# Patient Record
Sex: Male | Born: 1965 | Race: White | Hispanic: No | Marital: Married | Smoking: Former smoker
Health system: Southern US, Community
[De-identification: ages and names within clinical notes are randomized; demographics above are authoritative.]

## PROBLEM LIST (undated history)

## (undated) DIAGNOSIS — I2510 Atherosclerotic heart disease of native coronary artery without angina pectoris: Secondary | ICD-10-CM

## (undated) DIAGNOSIS — Z720 Tobacco use: Secondary | ICD-10-CM

## (undated) DIAGNOSIS — E785 Hyperlipidemia, unspecified: Secondary | ICD-10-CM

## (undated) DIAGNOSIS — I10 Essential (primary) hypertension: Secondary | ICD-10-CM

## (undated) HISTORY — DX: Atherosclerotic heart disease of native coronary artery without angina pectoris: I25.10

## (undated) HISTORY — DX: Essential (primary) hypertension: I10

## (undated) HISTORY — DX: Hyperlipidemia, unspecified: E78.5

## (undated) HISTORY — DX: Tobacco use: Z72.0

---

## 2002-10-06 HISTORY — PX: CORONARY ANGIOPLASTY: SHX604

## 2003-01-05 ENCOUNTER — Encounter: Payer: Self-pay | Admitting: Urology

## 2003-01-05 ENCOUNTER — Ambulatory Visit (HOSPITAL_BASED_OUTPATIENT_CLINIC_OR_DEPARTMENT_OTHER): Admission: RE | Admit: 2003-01-05 | Discharge: 2003-01-05 | Payer: Self-pay | Admitting: Urology

## 2003-08-07 HISTORY — PX: KIDNEY STONE SURGERY: SHX686

## 2003-08-19 ENCOUNTER — Inpatient Hospital Stay (HOSPITAL_COMMUNITY): Admission: EM | Admit: 2003-08-19 | Discharge: 2003-08-22 | Payer: Self-pay | Admitting: Emergency Medicine

## 2009-07-09 ENCOUNTER — Ambulatory Visit (HOSPITAL_COMMUNITY): Admission: RE | Admit: 2009-07-09 | Discharge: 2009-07-09 | Payer: Self-pay | Admitting: Urology

## 2010-12-14 IMAGING — CR DG ABDOMEN 1V
2 series · 2 of 2 positions shown · non-contrast
Comparison: None available.

CLINICAL DATA: Left renal stone.  Preoperative film.

ABDOMEN - 1 VIEW

[t abdomen supine (1 of 2)]
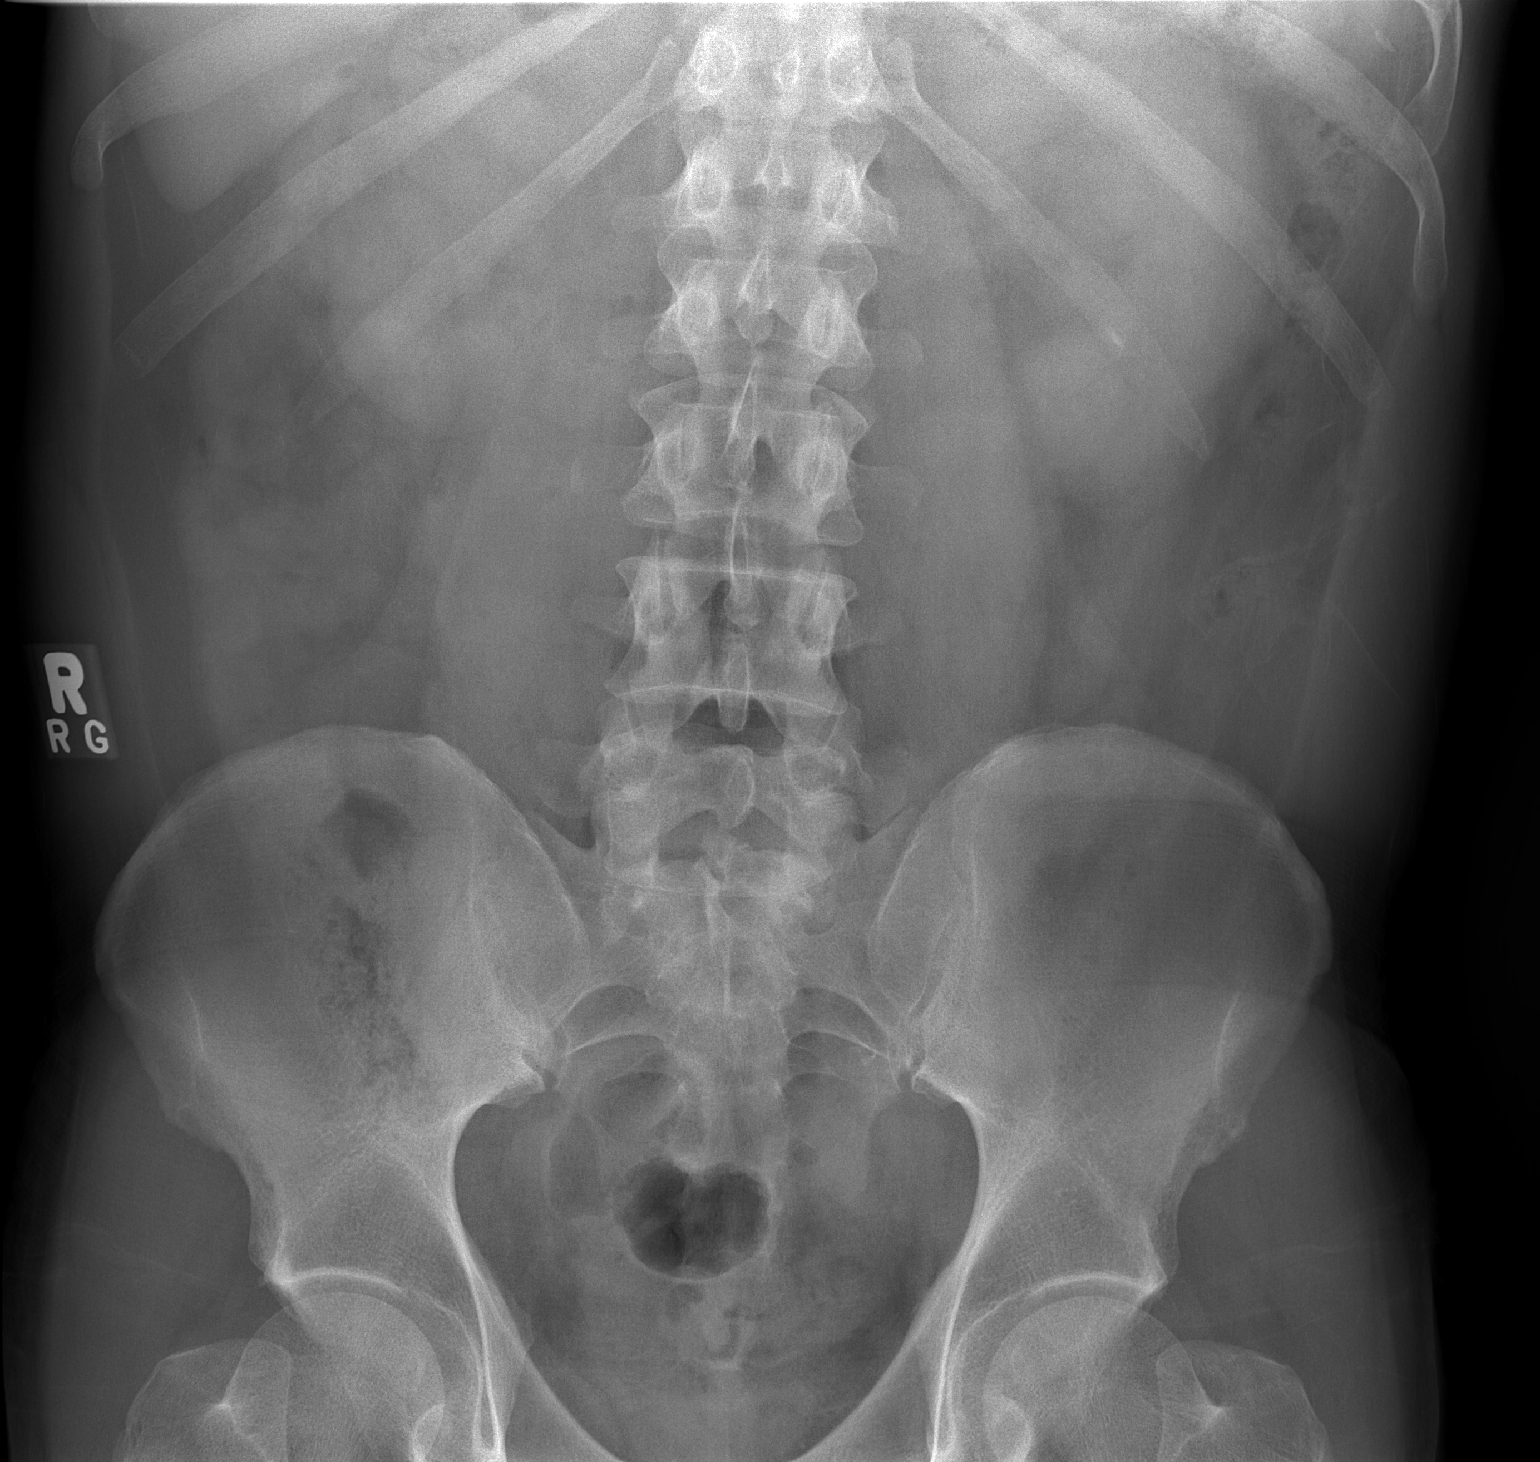

[t abdomen supine (2 of 2)]
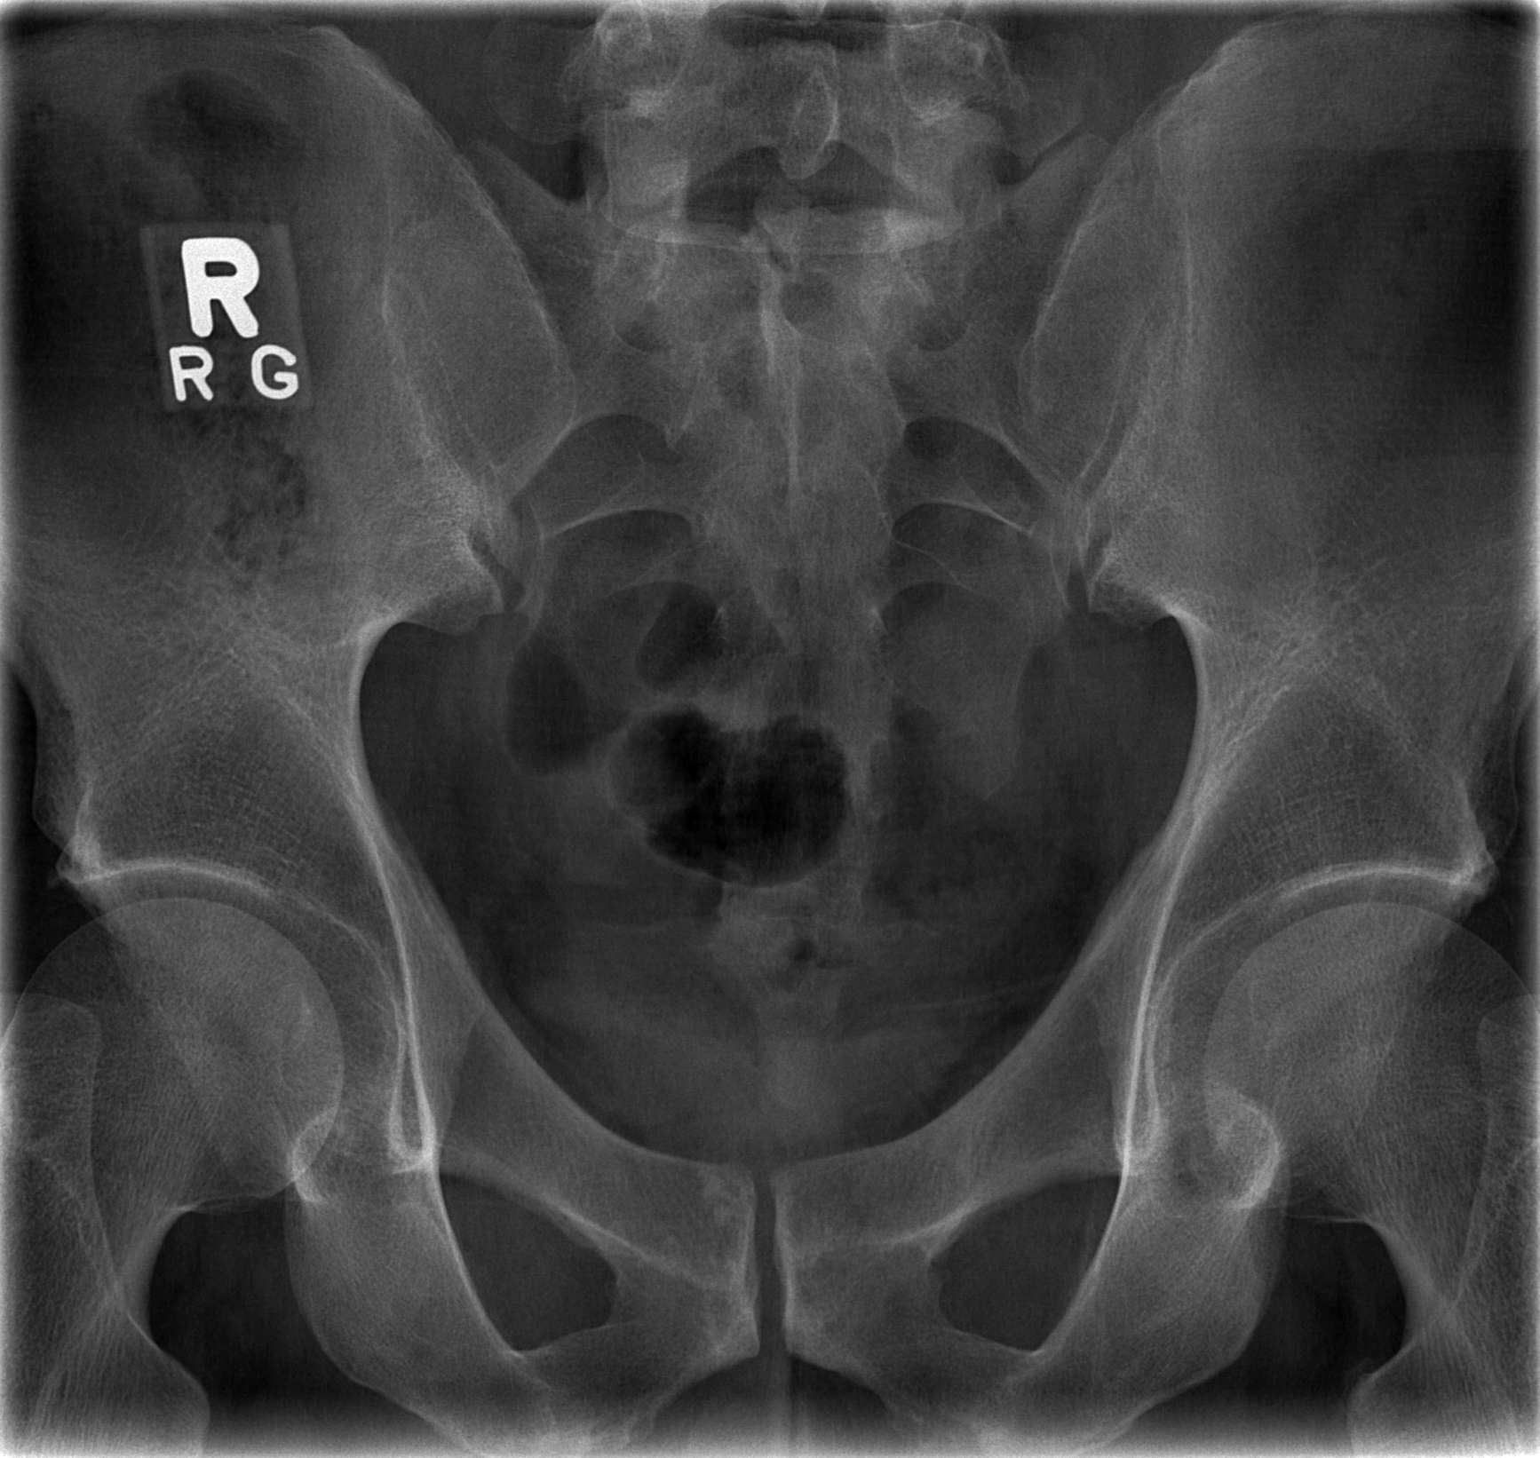

[2 of 2 positions shown; findings below may reference images not displayed]

FINDINGS: There is a radiopaque density projecting over the lower
pole left kidney and left twelfth rib measuring 0.4 cm compatible
with a renal stone.  No other unexpected abdominal calcifications
are identified.  Bowel gas pattern is unremarkable.
IMPRESSION: Findings compatible with a 0.4 cm left renal stone.  Alternatively,
this could represent a small bone island in the left 12th rib.

## 2011-02-21 NOTE — Cardiovascular Report (Signed)
NAME:  Mark Stafford, Mark Stafford                            ACCOUNT NO.:  [id number]   MEDICAL RECORD NO.:  [id number]                   PATIENT TYPE:  INP   LOCATION:  2927                                 FACILITY:  [facility]   PHYSICIAN:  Richard A. Alanda Amass, M.D.          DATE OF BIRTH:  08-14-1966   DATE OF PROCEDURE:  08/19/2003  DATE OF DISCHARGE:                              CARDIAC CATHETERIZATION   PROCEDURE:  Retrograde central aortic catheterization, selective coronary  angiography by Judkins technique, left ventricular angiogram by RAO and LAO  projections, subselective left internal mammary artery, right internal  mammary artery, abdominal aortic angiogram, hand injection, recanalizations  distal left anterior descending total occlusion with ongoing apical  myocardial infarction and associated wall motion abnormality, POBA,  Integrilin double bolus plus infusion, Plavix 300 emergency, 300 lab p.o.  Aspirin administration.  Weight adjusted heparin.   CARDIOLOGIST:  Richard A. Alanda Amass, M.D.   INDICATIONS:  Mr. Hartig is a 45 year old white married father of two boys  (82 and 31).  He is a 1-2 pack a day smoker and works at [employer].  He has no  prior coronary history.  Both parents are living with no coronary history,  but he has a maternal uncle with an myocardial infarction in his 25s.  There  is no history of hypertension, diabetes or thyroid disease.  He does not  take regular aspirin and cholesterol status is unknown.   He had a one week history of flu-like illness with mild cough and some mild  muscle aches.  He awoke at 7 a.m. on August 19, 2003 with left shoulder  discomfort that was persistent.  He had some vague upper sternal discomfort  and some vague radiation to his right shoulder.  He was [facility] Everlene [facility]  EKG showed inferolateral ST segment elevation and the  patient was referred for further evaluation.   He was transferred by EMT to the emergency  room where he had vague left  shoulder discomfort and no other significant symptoms.  He did have a run of  4-5 beats of rapid VT and SVT, asymptomatic.  He was hemodynamically stable.  There were no evolutionary changes but there was persistent inferolateral ST  elevation compatible with ischemia.  Early enzyme elevation of MB to 11.6,  troponin to 0.5 and myoglobin to 230 were present.  It was elected to  proceed to the catheterization laboratory for emergency catheterization in  this setting.   The patient was brought to the second floor.  He was given 5000 units of  heparin in the emergency room, 5 mg of Lopressor IV, one adult aspirin  (previous four baby aspirin at [facility]) and double bolus Integrilin and  Integrilin drip were begun along with 300 mg of Plavix p.o.   DESCRIPTION OF PROCEDURE:  The patient was brought to the second floor CP  Lab.  The  right groin was prepped and draped in the usual manner.  Then 1%  Xylocaine was used for local anesthesia and the CRFA was entered with a  single puncture using an 18 thin wall needle and a 6 French short Diag side-  arm sheath was inserted without difficulty.  Diagnostic procedure was done  with the  French 4 cm taper preformed coronary and pigtail Cordis catheters.  The LV angiogram was done in the ROA and LOA projections, 25 mL, 14 mL per  second, 28 mL, 12 mL per second.  Pullback pressure in the CA showed no  gradient across the aortic valve.   Subselective LIMA and RIMA were done with the right coronary catheter which  revealed patent IMAs, antegrade vertebral flow, no brachiocephalic or  subclavian stenosis.   Hand injection above the level of the renal arteries with the pigtail  catheter was performed showing bilateral single normal renal arteries and  normal-appearing infrarenal abdominal aorta on limited injection.   PRESSURES:  LV 130/8; LV-EDP 18-20 mmHg.  CA: 130/78 mmHg.   There was no gradient across the aortic  valve on catheter pullback.   LEFT VENTRICULAR ANGIOGRAM:  The left ventricular angiogram revealed distal  quarter of the anterolateral wall apical and distal quarter of the inferior  wall hypo-akinesis and posterior and septal areas with hypo-akinesis. The  remainder of the ventricle contracted well.  EF was greater than 50% and  there was no significant MR.   CORONARY ANGIOGRAPHY:  1. The main left coronary artery was normal.  2. The circumflex was a dominant vessel.  There was mild irregularity with     less than 30% narrowing in the proximal third after the small first and     second marginal branches.  There was a moderately large fourth marginal     branch that had 40% narrowing proximally and 40-50% narrowing just     proximal to the mid bifurcation but good flow.  3. The PLA had no significant stenosis; two large PDA branches were seen and     the second PDA branch had a 40-50% narrowing but good flow and no     thrombus.  4. The left anterior descending had mild irregularity in the proximal third     after the first septal perforator and a large first diagonal that was     normal.  There was a moderate size diagonal from the junction of the mid     LAD.  There was total occlusion of the distal third of the LAD to the     apex.  The undersurface bifurcation was filled late by string-like     collaterals.  5. The right coronary was nondominant.  There were irregularities proximal     with 50% narrowing in the mid portion and then predominantly small to     moderate size RV branches.   DISCUSSION:  It appears that this patient's apical MI is probably related to  distal LAD-apical occlusion with associated wall motion abnormality.  It is  not clear how much of this is atherosclerotic versus thrombotic, and he  might be somewhat hypercoagulable.  Despite his 5000 units of heparin in the emergency room and double bolus Aggrastat infusion he still had a  subtherapeutic ACT in the  lab, and he was given another 5000 units IV.  He  was given 4 mg of Nubain and 4 mg of Versed for sedation and another 5 mg IV  Lopressor in the laboratory.  The patient did have ongoing substernal chest discomfort associated with  angiography and left shoulder discomfort that was persistent so it was  elected to proceed with attempted recannulization of the distal LAD.   The LAD after the ACT was therapeutic and it was intubated with a JL-4 soft  tip 6 [place] [employer] guiding catheter.  A __________ light guidewire was  advanced under fluoroscopic control to the distal left anterior descending.  We were able to recanalize the distal LAD, and the wire was free in the  undersurface.  A small 2.0/12 ACS Voyager balloon was then passed to the  distal LAD near the apex and low pressure inflations were done from 3-5  atmospheres x38-55 seconds x5.  The balloon was pulled back.  There was  initially some guidewire spasm which was relieved with guidewire removal and  200 mcg of IC nitroglycerin.  Final injection showed restoration of TIMI-3  flow.  There was a little pinch at the very apex but we decided not to  pursue this further.  There was moderate size distal LAD vessel and good  bifurcation visualized.   The dilatation system was removed.  Side arm sheath was flushed and secured  to the skin with #1 silk suture and finally ACT was 237 seconds.   The patient was given an additional 300 mg of Plavix in the laboratory  (total 600 mg).  We planned to discontinue his heparin and continue 2B/3A  inhibitors, aspirin, Plavix and beta-blockers. I  would recommend  institution statin agent while his fasting lipids are pending.   It is not clear whether this patient had atherosclerotic and/or thrombotic  occlusion of the distal LAD.  It is noncritical disease of the dominant  circumflex and nondominant right and despite his young age of 72, he does  have atherosclerotic vascular disease.    Final perfusion time was approximately 10 hours 30 minutes from the onset of  pain.   CATHETERIZATION DIAGNOSES:  1. Atherosclerotic heart disease, acute apical myocardial infarction.  2. Successful recannulization and reperfusion with POBA.  RT 10 hours 30     minutes.  Restoration of TIMI-3 flow.  Small infarction.  3. Noncritical associated coronary disease dominant in the circumflex.  4. Ejection fraction of approximately 50% or greater.  5. Cholesterol status unknown.  6. Mild hypertension.   RECOMMENDATIONS:  Recommend lifestyle changes with exercise program,  discontinuation of smoking, and continuing medical therapy.                                               Richard A. Alanda Amass, M.D.    RAW/MEDQ  D:  08/19/2003  T:  08/19/2003  Job:  [id number]   cc:   Dani Gobble, MD  [phone] [address]  [place]  [place] [address]  Fax: [phone]   Tracey Harries, M.D. [address]  [place]  [place] [address]  Fax: [phone]   Catheterization Lab - 2nd floor

## 2011-02-21 NOTE — Discharge Summary (Signed)
NAME:  Mark Stafford, Mark Stafford                            ACCOUNT NO.:  [id number]   MEDICAL RECORD NO.:  [id number]                   PATIENT TYPE:  INP   LOCATION:  2004                                 FACILITY:  [facility]   PHYSICIAN:  Richard A. Alanda Amass, M.D.          DATE OF BIRTH:  [birth year]   DATE OF ADMISSION:  08/19/2003  DATE OF DISCHARGE:                                 DISCHARGE SUMMARY   CHIEF COMPLAINT:  Chest pain and cough.   HISTORY OF PRESENT ILLNESS:  The patient is a 45 year old male who presents  to the emergency room with complaints of cough and chest pain.  He has had  one to two weeks of upper respiratory type symptoms with dry cough and  flulike muscle aches.  He denies any fever or chills.  On the day of  admission, he awoke with some left shoulder pain and chest discomfort.  He  came to urgent care.  EKG shows some inferior lateral ST changes and  elevation and he was transferred to [facility] ER.  He  was treated with aspirin and nitroglycerin at urgent care with improvement  in his symptoms.  When he arrived at [facility] ER he  was pain-free.   MEDICATIONS:  He has no medications at home.   ALLERGIES:  No known drug allergies.   SOCIAL HISTORY:  He is married.  He works for [employer].  He has two  children, both boys from a previous marriage.  He does smoke a pack a day.   FAMILY HISTORY:  This is remarkable in that he had an uncle who had an MI in  his 43s.  Both his mother and father are alive and well without serious  medical problems.   REVIEW OF SYMPTOMS:  Essentially unremarkable except for noted above.  He  does have a history of kidney stones and had lithotripsy in June of 2004.  He was followed by Dr. Annabell Howells.  There is no prior history of diabetes or  hypertension and his lipid status is unknown.  Review of systems otherwise  unremarkable.   PAST MEDICAL HISTORY:  Otherwise unremarkable.   PHYSICAL  EXAMINATION:  GENERAL APPEARANCE:  He is a well-developed, well-  nourished male in no acute distress.  VITAL SIGNS:  Blood pressure 140/70, pulse 90, he is afebrile.  HEENT:  Normocephalic.  Extraocular movements are intact.  Sclera is  nonicteric.  Lids and conjunctivae are within normal limits.  NECK:  Without JVD, without bruits.  Thyroid is not enlarged.  CHEST:  Clear to auscultation and percussion.  CARDIOVASCULAR:  Regular rate and rhythm with no murmurs, rubs, or gallops.  Normal S1 and S2.  ABDOMEN:  Nontender with no hepatosplenomegaly, no bruits.  EXTREMITIES:  No edema.  Distal pulses are intact.  NEUROLOGIC:  Grossly intact.  He is awake, alert and oriented.  He is  cooperative and moves all extremities without obvious deficit.   His EKG shows inferior lateral ST elevation consistent with inferolateral  MI.   IMPRESSION:  1. Inferior lateral myocardial infarction by EKG.  2. History of smoking.  3. Family history of coronary disease.   PLAN:  The patient was seen by Dr. Alanda Amass.  Initial CK-MBs were elevated.  Troponin was 0.5.  He was taken to the catheterization lab for further  evaluation.      Abelino Derrick, P.A.                      Richard A. Alanda Amass, M.D.    Lenard Lance  D:  08/22/2003  T:  08/22/2003  Job:  [id number]   cc:   Brett Canales A. Cleta Alberts, M.D.  [address]  [place]  [place] [address]  Fax: [phone]

## 2011-02-21 NOTE — Discharge Summary (Signed)
NAME:  Mark Stafford, Mark Stafford                            ACCOUNT NO.:  [id number]   MEDICAL RECORD NO.:  [id number]                   PATIENT TYPE:  INP   LOCATION:  2004                                 FACILITY:  [facility]   PHYSICIAN:  Richard A. Alanda Amass, M.D.          DATE OF BIRTH:  [birth year]   DATE OF ADMISSION:  08/19/2003  DATE OF DISCHARGE:  08/22/2003                                 DISCHARGE SUMMARY   DISCHARGE DIAGNOSES:  1. Acute apical myocardial infarction, treated with left anterior descending     artery percutaneous coronary intervention this admission.  2. History of smoking.  3. Dyslipidemia.  4. Family history of coronary disease.  5. History of nephrolithiasis.   HOSPITAL COURSE:  The patient is a 45 year old male who had been previously  been followed by Dr. Cleta Alberts [facility]. [facility] with history of one to two weeks of upper respiratory type  symptoms. On the day he presented, he had some chest pain and left shoulder  pain. EKG at [facility] was abnormal with inferolateral ST elevation. He  was transferred to [facility] ER and seen by Dr. Alanda Amass. Symptoms did  improve with nitroglycerin and aspirin. His troponins were elevated  initially at 0.5 with a MB of 11. He was taken urgently to the  catheterization lab by Dr. Alanda Amass. Catheterization revealed a 50%  nondominant RCA, 30% proximal circumflex, 40% OM2, 40% distal circumflex,  and a totalled distal LAD. EF was 50%. There was apical hypokinesis. The  patient underwent PCI to the LAD. He tolerated this well. He was put on  Integrilin for 18 hours. Plan is for continued aggressive medical therapy  and risk factor modification. The patient was transferred to the floor. His  CKs peaked at 510 with 55 MBs. Lipid profile revealed a HDL of 31, LDL of  92. Statin and Niaspan were added. We feel he can be discharged August 22, 2003.   DISCHARGE MEDICATIONS:  1. Coated aspirin  once a day.  2. Plavix 75 mg a day.  3. Toprol-XL 50 mg a day.  4. Protonix 40 mg a day for one month and then p.r.n.  5. Wellbutrin SR 150 once a day for two days and then twice a day for two     months.  6. Zithromax 250 mg a day for two days.  7. Nitroglycerin sublingual p.r.n.  8. Advicor 500/20 one h.s.   LABORATORY DATA:  Sodium 139, potassium 3.7, BUN 8, creatinine 1.0. SGOT was  40, SGPT 27. White count 12.4, hemoglobin 13.9, hematocrit 40.6, platelets  325. Lipid profile showed a total cholesterol of 187, triglycerides 306, HDL  35, LDL 91. INR 0.9. Sodium 139, potassium 3.7, BUN 8, creatinine 1.0. CKs  peaked at 510 with 55 MBs. Chest x-ray shows no active disease. EKG at  discharge shows sinus rhythm with inferior T-wave inversion and  lateral T-  wave inversion.   DISPOSITION:  The patient is discharged in stable condition. He has been  instructed not to return to work until cleared by Dr. Alanda Amass. He has also  been instructed to avoid driving. We will see him back in the office in two  weeks.      Abelino Derrick, P.A.                      Richard A. Alanda Amass, M.D.    Lenard Lance  D:  08/22/2003  T:  08/22/2003  Job:  [id number]   cc:   Brett Canales A. Cleta Alberts, M.D.  [address]  [place]  [place] [address]  Fax: [phone]

## 2013-06-08 ENCOUNTER — Other Ambulatory Visit: Payer: Self-pay | Admitting: *Deleted

## 2013-06-08 DIAGNOSIS — I2510 Atherosclerotic heart disease of native coronary artery without angina pectoris: Secondary | ICD-10-CM

## 2013-06-09 ENCOUNTER — Encounter: Payer: Self-pay | Admitting: Cardiovascular Disease

## 2013-10-11 ENCOUNTER — Inpatient Hospital Stay (HOSPITAL_COMMUNITY): Admission: RE | Admit: 2013-10-11 | Payer: Self-pay | Source: Ambulatory Visit

## 2013-10-27 ENCOUNTER — Ambulatory Visit (HOSPITAL_COMMUNITY)
Admission: RE | Admit: 2013-10-27 | Discharge: 2013-10-27 | Disposition: A | Discharge status: Home / Self Care | Payer: BC Managed Care – PPO | Source: Ambulatory Visit | Attending: Internal Medicine | Admitting: Internal Medicine

## 2013-10-27 DIAGNOSIS — I079 Rheumatic tricuspid valve disease, unspecified: Secondary | ICD-10-CM | POA: Insufficient documentation

## 2013-10-27 DIAGNOSIS — I2510 Atherosclerotic heart disease of native coronary artery without angina pectoris: Secondary | ICD-10-CM | POA: Insufficient documentation

## 2013-10-27 DIAGNOSIS — I059 Rheumatic mitral valve disease, unspecified: Secondary | ICD-10-CM

## 2013-10-27 NOTE — Progress Notes (Signed)
2D Echo Performed 10/27/2013    Maha Fischel, RCS  

## 2013-11-16 ENCOUNTER — Telehealth: Payer: Self-pay | Admitting: *Deleted

## 2013-11-16 NOTE — Telephone Encounter (Signed)
Pt was calling in regards to his medication that Dr. Alanda AmassWeintraub used to prescribe to him. I do not see any medication listed in his chart. He stated that [facility] [employer] has tried to contact us and there is no reply from us. He is completely out of medication.  RAW-JB

## 2013-11-17 MED ORDER — ATORVASTATIN CALCIUM 80 MG PO TABS: 80.0000 mg | ORAL_TABLET | Freq: Every day | ORAL | Status: DC

## 2013-11-17 MED ORDER — PANTOPRAZOLE SODIUM 40 MG PO TBEC: 40.0000 mg | DELAYED_RELEASE_TABLET | Freq: Every day | ORAL | Status: DC

## 2013-11-17 MED ORDER — NEBIVOLOL HCL 5 MG PO TABS: 5.0000 mg | ORAL_TABLET | Freq: Every day | ORAL | Status: DC

## 2013-11-17 NOTE — Telephone Encounter (Signed)
Returned call.  Left message that message received, but not the name of medication.  Asked that he call back today before 4pm w/ that information so script can be sent to pharmacy.

## 2013-11-17 NOTE — Telephone Encounter (Signed)
Pt called back and verified x 2.  Pt stated he left information yesterday w/ what he needed.  Pt informed the message did not contain a list of meds, but RN can send in refills if he can list them now for 90-day w/o refills until he is seen by new cardiologist.  Pt verbalized understanding and agreed w/ plan.  Pantoprazole 40 mg daily, atorvastatin 80 mg daily and Bystolic 5 mg daily.  Pt stated he is out of all meds and informed samples available of Bystolic until mail order arrives.  Pt verbalized understanding and agreed w/ plan.  Pt will pick up samples and restart other meds once received.   Refill(s) sent to pharmacy.

## 2013-12-02 ENCOUNTER — Telehealth: Payer: Self-pay | Admitting: Cardiovascular Disease

## 2013-12-02 DIAGNOSIS — E782 Mixed hyperlipidemia: Secondary | ICD-10-CM

## 2013-12-02 DIAGNOSIS — Z79899 Other long term (current) drug therapy: Secondary | ICD-10-CM

## 2013-12-02 MED ORDER — NEBIVOLOL HCL 5 MG PO TABS: 5.0000 mg | ORAL_TABLET | Freq: Every day | ORAL | Status: DC

## 2013-12-02 MED ORDER — ATORVASTATIN CALCIUM 80 MG PO TABS: 80.0000 mg | ORAL_TABLET | Freq: Every day | ORAL | Status: DC

## 2013-12-02 MED ORDER — PANTOPRAZOLE SODIUM 40 MG PO TBEC: 40.0000 mg | DELAYED_RELEASE_TABLET | Freq: Every day | ORAL | Status: DC

## 2013-12-02 MED ORDER — FENOFIBRATE 145 MG PO TABS: 145.0000 mg | ORAL_TABLET | Freq: Every day | ORAL | Status: DC

## 2013-12-02 MED ORDER — CLOPIDOGREL BISULFATE 75 MG PO TABS: 75.0000 mg | ORAL_TABLET | Freq: Every day | ORAL | Status: DC

## 2013-12-02 MED ORDER — NIACIN ER (ANTIHYPERLIPIDEMIC) 1000 MG PO TBCR: 1000.0000 mg | EXTENDED_RELEASE_TABLET | Freq: Every day | ORAL | Status: DC

## 2013-12-02 NOTE — Telephone Encounter (Signed)
Walk-In  Pt with concerns about refills that were supposed to be sent 2 weeks ago.  Reviewed chart and Rxs printed.  Pt informed and RN apologized.  Resent Rxs.  Pt also needed refills on other meds.  Paper chart reviewed w/ pt and refills sent.  Pt will get refills on NTG and Cialis prn.  Pt has new appt on 3.17.15 w/ Dr. Allyson SabalBerry to establish care (former RW pt).    Pt also informed labs needed r/t warning w/ fenofibrate and atorvastatin.  Spoke w/ Abbe AmsterdamKathryn Vogel, RN (Dr. Allyson SabalBerry) who advised pt have CMP and Lipid Profile.  Labs ordered and pt advised to have completed no later than 3.16.15.  Pt verbalized understanding and agreed w/ plan.  Pt discharged w/ lab slip and samples of Bystolic in hand.  Pt will call back if he needs temporary Rx of clopidogrel sent to local pharmacy.

## 2013-12-02 NOTE — Telephone Encounter (Signed)
Pt walked in to speak with Amber about refills.

## 2013-12-16 LAB — COMPREHENSIVE METABOLIC PANEL
ALT: 16 U/L (ref 0–53)
AST: 25 U/L (ref 0–37)
Albumin: 4.5 g/dL (ref 3.5–5.2)
Alkaline Phosphatase: 44 U/L (ref 39–117)
BUN: 14 mg/dL (ref 6–23)
CO2: 27 mEq/L (ref 19–32)
Calcium: 9.5 mg/dL (ref 8.4–10.5)
Chloride: 106 mEq/L (ref 96–112)
Creat: 0.89 mg/dL (ref 0.50–1.35)
Glucose, Bld: 93 mg/dL (ref 70–99)
Potassium: 4.5 mEq/L (ref 3.5–5.3)
Sodium: 141 mEq/L (ref 135–145)
Total Bilirubin: 0.6 mg/dL (ref 0.2–1.2)
Total Protein: 6.9 g/dL (ref 6.0–8.3)

## 2013-12-16 LAB — LIPID PANEL
Cholesterol: 165 mg/dL (ref 0–200)
HDL: 45 mg/dL (ref >39)
LDL Cholesterol: 100 mg/dL — ABNORMAL HIGH (ref 0–99)
Total CHOL/HDL Ratio: 3.7 Ratio
Triglycerides: 100 mg/dL (ref <150)
VLDL: 20 mg/dL (ref 0–40)

## 2013-12-19 ENCOUNTER — Encounter: Payer: Self-pay | Admitting: *Deleted

## 2013-12-20 ENCOUNTER — Encounter: Payer: Self-pay | Admitting: Cardiovascular Disease

## 2013-12-20 ENCOUNTER — Ambulatory Visit (INDEPENDENT_AMBULATORY_CARE_PROVIDER_SITE_OTHER): Payer: BC Managed Care – PPO | Admitting: Cardiovascular Disease

## 2013-12-20 VITALS — BP 122/80 | HR 68 | Ht 67.0 in | Wt 156.5 lb

## 2013-12-20 DIAGNOSIS — E785 Hyperlipidemia, unspecified: Secondary | ICD-10-CM

## 2013-12-20 DIAGNOSIS — I10 Essential (primary) hypertension: Secondary | ICD-10-CM | POA: Insufficient documentation

## 2013-12-20 DIAGNOSIS — I2510 Atherosclerotic heart disease of native coronary artery without angina pectoris: Secondary | ICD-10-CM | POA: Insufficient documentation

## 2013-12-20 DIAGNOSIS — F17200 Nicotine dependence, unspecified, uncomplicated: Secondary | ICD-10-CM

## 2013-12-20 DIAGNOSIS — Z79899 Other long term (current) drug therapy: Secondary | ICD-10-CM

## 2013-12-20 DIAGNOSIS — Z720 Tobacco use: Secondary | ICD-10-CM

## 2013-12-20 DIAGNOSIS — Z9861 Coronary angioplasty status: Secondary | ICD-10-CM

## 2013-12-20 MED ORDER — EZETIMIBE 10 MG PO TABS: 10.0000 mg | ORAL_TABLET | Freq: Every day | ORAL | Status: DC

## 2013-12-20 NOTE — Assessment & Plan Note (Signed)
On statin therapy as was fenofibrate and niacin. His most recent lipid profile performed yesterday revealed a glucose of 165, LDL 100 HDL 45. He is still not at goal. He apparently eats a healthy diet". I'm going to add Zetia in an attempt to get him to call. If I am unable to [facility] for further oncologic treatment.

## 2013-12-20 NOTE — Patient Instructions (Signed)
  Your physician wants you to follow-up with him in : 1 year with Dr Allyson SabalBerry                                            and with an extender in : 6 months with an extender                    You will receive a reminder letter in the mail one month in advance. If you don't receive a letter, please call our office to schedule the follow-up appointment.   Your physician recommends that you return for lab work in: 2 months, fasting   Your physician has recommended you make the following change in your medication:  Start zetia 10mg  daily

## 2013-12-20 NOTE — Assessment & Plan Note (Signed)
Under good control on current medications 

## 2013-12-20 NOTE — Assessment & Plan Note (Signed)
The patient has a history of known coronary artery disease. He had an anterior STEMI 08/19/03 and underwent cardiac catheterization by Dr. Alanda AmassWeintraub revealing an occlusion of the apical LAD which was angioplasty. he has a left dominant system with scattered 40-50% stenoses. He had apical hypokinesia at that time. Recent echo was normal a Myoview stress test done 3 years ago showed no infarct. The patient is completely asymptomatic.

## 2013-12-20 NOTE — Progress Notes (Signed)
12/20/2013 Mark Stafford   05-May-1966  [id number]  Primary Physician No PCP Per Patient Primary Cardiologist: Runell Gess MD Roseanne Reno   HPI:  Mark Stafford is a delightful 48 year old thin and fit appearing married Caucasian male father of 2 children he was formally a patient of Dr. Alanda Amass. I am assuming his care. His primary care physician is Dr. Silvestre Moment at [facility]. He has a history of CAD status post anterior wall myocardial infarction 08/19/03. He underwent cardiac catheterization by Dr. Alanda Amass revealing an occluded apical LAD which underwent angioplasty. He had a left dominant system with scattered 30-40% stenoses otherwise. His EF was 50% with apical hypokinesia at that time. This has since improved to normal. A Myoview stress test performed in 2001 and showed no ischemia or infarct. He is completely asymptomatic. 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 Is clinically side to profile is remarkable for ongoing tobacco abuse and at one half pack per day recalcitrant factor modification. He is treated hypertension and hyperlipidemia.   Current Outpatient Prescriptions  Medication Sig Dispense Refill  . aspirin 81 MG tablet Take 81 mg by mouth daily.      Marland Kitchen atorvastatin (LIPITOR) 80 MG tablet Take 1 tablet (80 mg total) by mouth daily.  90 tablet  0  . clopidogrel (PLAVIX) 75 MG tablet Take 1 tablet (75 mg total) by mouth daily.  90 tablet  0  . fenofibrate (TRICOR) 145 MG tablet Take 1 tablet (145 mg total) by mouth daily.  90 tablet  0  . nebivolol (BYSTOLIC) 5 MG tablet Take 1 tablet (5 mg total) by mouth daily.  90 tablet  0  . niacin (NIASPAN) 1000 MG CR tablet Take 1 tablet (1,000 mg total) by mouth at bedtime.  90 tablet  0  . pantoprazole (PROTONIX) 40 MG tablet Take 1 tablet (40 mg total) by mouth daily.  90 tablet  0  . ezetimibe (ZETIA) 10 MG tablet Take 1 tablet (10 mg total) by mouth daily.  90 tablet  3   No current facility-administered  medications for this visit.    No Known Allergies  History   Social History  . Marital Status: Married    Spouse Name: N/A    Number of Children: N/A  . Years of Education: N/A   Occupational History  . Not on file.   Social History Main Topics  . Smoking status: Current Every Day Smoker -- 0.50 packs/day    Types: Cigarettes  . Smokeless tobacco: Not on file  . Alcohol Use: Not on file  . Drug Use: Not on file  . Sexual Activity: Not on file   Other Topics Concern  . Not on file   Social History Narrative  . No narrative on file     Review of Systems: General: negative for chills, fever, night sweats or weight changes.  Cardiovascular: negative for chest pain, dyspnea on exertion, edema, orthopnea, palpitations, paroxysmal nocturnal dyspnea or shortness of breath Dermatological: negative for rash Respiratory: negative for cough or wheezing Urologic: negative for hematuria Abdominal: negative for nausea, vomiting, diarrhea, bright red blood per rectum, melena, or hematemesis Neurologic: negative for visual changes, syncope, or dizziness All other systems reviewed and are otherwise negative except as noted above.    Blood pressure 122/80, pulse 68, height 5\' 7"  (1.702 m), weight 156 lb 8 oz (70.988 kg).  General appearance: alert and no distress Neck: no adenopathy, no carotid bruit, no JVD, supple,  symmetrical, trachea midline and thyroid not enlarged, symmetric, no tenderness/mass/nodules Lungs: clear to auscultation bilaterally Heart: regular rate and rhythm, S1, S2 normal, no murmur, click, rub or gallop Abdomen: soft, non-tender; bowel sounds normal; no masses,  no organomegaly Extremities: extremities normal, atraumatic, no cyanosis or edema and 2+ pedal pulses bilaterally  EKG normal sinus rhythm at 68 without ST or T wave changes  ASSESSMENT AND PLAN:   Coronary artery disease The patient has a history of known coronary artery disease. He had an anterior  STEMI 08/19/03 and underwent cardiac catheterization by Dr. Alanda AmassWeintraub revealing an occlusion of the apical LAD which was angioplasty. he has a left dominant system with scattered 40-50% stenoses. He had apical hypokinesia at that time. Recent echo was normal a Myoview stress test done 3 years ago showed no infarct. The patient is completely asymptomatic.  Essential hypertension Under good control on current medications  Hyperlipidemia On statin therapy as was fenofibrate and niacin. His most recent lipid profile performed yesterday revealed a glucose of 165, LDL 100 HDL 45. He is still not at goal. He apparently eats a healthy diet". I'm going to add Zetia in an attempt to get him to call. If I am unable to [facility] for further oncologic treatment.      Runell GessJonathan J. Dierre Crevier MD FACP,FACC,FAHA, [facility] 12/20/2013 8:47 AM

## 2014-04-10 ENCOUNTER — Other Ambulatory Visit: Payer: Self-pay | Admitting: Cardiovascular Disease

## 2014-04-10 NOTE — Telephone Encounter (Signed)
Rx refill sent to patient pharmacy   

## 2014-05-03 LAB — LIPID PANEL
Cholesterol: 103 mg/dL (ref 0–200)
HDL: 43 mg/dL (ref >39)
LDL Cholesterol: 43 mg/dL (ref 0–99)
Total CHOL/HDL Ratio: 2.4 Ratio
Triglycerides: 83 mg/dL (ref <150)
VLDL: 17 mg/dL (ref 0–40)

## 2014-05-03 LAB — HEPATIC FUNCTION PANEL
ALT: 24 U/L (ref 0–53)
AST: 22 U/L (ref 0–37)
Albumin: 4.7 g/dL (ref 3.5–5.2)
Alkaline Phosphatase: 47 U/L (ref 39–117)
Bilirubin, Direct: 0.1 mg/dL (ref 0.0–0.3)
Indirect Bilirubin: 0.4 mg/dL (ref 0.2–1.2)
Total Bilirubin: 0.5 mg/dL (ref 0.2–1.2)
Total Protein: 7.4 g/dL (ref 6.0–8.3)

## 2014-05-08 ENCOUNTER — Encounter: Payer: Self-pay | Admitting: *Deleted

## 2014-09-21 ENCOUNTER — Encounter: Payer: Self-pay | Admitting: Cardiology

## 2014-09-22 ENCOUNTER — Encounter: Payer: Self-pay | Admitting: Cardiology

## 2014-09-22 ENCOUNTER — Ambulatory Visit (INDEPENDENT_AMBULATORY_CARE_PROVIDER_SITE_OTHER): Payer: BC Managed Care – PPO | Admitting: Cardiology

## 2014-09-22 VITALS — BP 120/70 | HR 70 | Ht 67.0 in | Wt 154.1 lb

## 2014-09-22 DIAGNOSIS — I2510 Atherosclerotic heart disease of native coronary artery without angina pectoris: Secondary | ICD-10-CM

## 2014-09-22 DIAGNOSIS — Z720 Tobacco use: Secondary | ICD-10-CM

## 2014-09-22 DIAGNOSIS — Z9861 Coronary angioplasty status: Secondary | ICD-10-CM

## 2014-09-22 DIAGNOSIS — E785 Hyperlipidemia, unspecified: Secondary | ICD-10-CM

## 2014-09-22 DIAGNOSIS — I10 Essential (primary) hypertension: Secondary | ICD-10-CM

## 2014-09-22 NOTE — Assessment & Plan Note (Signed)
Controlled.  

## 2014-09-22 NOTE — Patient Instructions (Addendum)
Corine ShelterLuke Kilroy, [place], has ordered the following test(s) to be done: 1. Blood work - to be done FASTING PRIOR TO YOUR 6 MONTH APPOINTMENT WITH DR BERRY. You will receive a phone call to remind you and a lab slip in the mail.  Your physician has recommended making the following medication changes: STOP Clopidogrel and Fenofibrate  Corine ShelterLuke Kilroy, PA-C, wants you to follow-up in 6 months with Dr Allyson SabalBerry. You will receive a reminder letter in the mail one months in advance. If you don't receive a letter, please call our office to schedule the follow-up appointment.  Medication samples have been provided to the patient.  Drug name: Zetia 10 mg Qty: 28 tablets  LOT: [id number]  Exp.Date: 12/2016  Ian Bushmanruitt, [place] 11:07 AM 09/22/2014

## 2014-09-22 NOTE — Assessment & Plan Note (Signed)
LAD PCI '04. Myoview low risk 2012. No angina

## 2014-09-22 NOTE — Assessment & Plan Note (Signed)
LDL 43 July 2015 after addition of Zetia

## 2014-09-22 NOTE — Progress Notes (Signed)
09/22/2014 Mark Stafford   11/28/1965  [id number]  Primary Physician Mark KimHEPLER,MARK, PA-C Primary Cardiologist: Dr Mark Stafford  HPI:  48 year-old thin and fit appearing married Caucasian male father of 2 children, formally a patient of Dr. Alanda Stafford. Now followed by Dr Mark Stafford. His primary care physician is [facility]. He has a history of CAD and is status post anterior wall myocardial infarction 08/19/03. He underwent cardiac catheterization by Dr. Alanda Stafford revealing an occluded apical LAD treated with angioplasty. He had a left dominant system with scattered 30-40% stenoses otherwise. His EF was 50% with apical hypokinesia at that time. This has since improved to normal. A Myoview stress test performed in Nov 2011 and showed no ischemia or infarct. He is completely asymptomatic. He is here today for 6 month f/u. He asked about "getting of some of these medications".  Current Outpatient Prescriptions  Medication Sig Dispense Refill  . aspirin 81 MG tablet Take 81 mg by mouth daily.    Marland Kitchen. atorvastatin (LIPITOR) 80 MG tablet TAKE 1 BY MOUTH DAILY 90 tablet 1  . BYSTOLIC 5 MG tablet TAKE 1 BY MOUTH DAILY 90 tablet 1  . ezetimibe (ZETIA) 10 MG tablet Take 1 tablet (10 mg total) by mouth daily. 30 tablet 0  . niacin (NIASPAN) 1000 MG CR tablet TAKE 1 BY MOUTH AT BEDTIME 90 tablet 1  . pantoprazole (PROTONIX) 40 MG tablet TAKE 1 BY MOUTH DAILY 90 tablet 1  . traMADol (ULTRAM) 50 MG tablet Take 50 mg by mouth every 6 (six) hours as needed. for pain  1   No current facility-administered medications for this visit.    No Known Allergies  History   Social History  . Marital Status: Married    Spouse Name: N/A    Number of Children: N/A  . Years of Education: N/A   Occupational History  . Not on file.   Social History Main Topics  . Smoking status: Current Every Day Smoker -- 0.25 packs/day    Types: Cigarettes  . Smokeless tobacco: Not on file  . Alcohol Use: Not on file    . Drug Use: Not on file  . Sexual Activity: Not on file   Other Topics Concern  . Not on file   Social History Narrative     Review of Systems: General: negative for chills, fever, night sweats or weight changes.  Cardiovascular: negative for chest pain, dyspnea on exertion, edema, orthopnea, palpitations, paroxysmal nocturnal dyspnea or shortness of breath Dermatological: negative for rash Respiratory: negative for cough or wheezing Urologic: negative for hematuria Abdominal: negative for nausea, vomiting, diarrhea, bright red blood per rectum, melena, or hematemesis Neurologic: negative for visual changes, syncope, or dizziness All other systems reviewed and are otherwise negative except as noted above.    Blood pressure 120/70, pulse 70, height 5\' 7"  (1.702 m), weight 154 lb 1.6 oz (69.899 kg).  General appearance: alert, cooperative and no distress Neck: no carotid bruit and no JVD Lungs: clear to auscultation bilaterally Heart: regular rate and rhythm  EKG NSR  ASSESSMENT AND PLAN:   CAD S/P percutaneous coronary angioplasty LAD PCI '04. Myoview low risk 2012. No angina  Hyperlipidemia LDL 43 July 2015 after addition of Zetia  Essential hypertension Controlled  Tobacco abuse 1/2 PPD- currently enrolled in a stop smoking program   PLAN  I reviewed his medications and labs in detail with him. I think we can stop the Plavix and Tricor (trigs were 83). He  could probably come off the Niaspan as well but he seems to tolerate this. We'll repeat his lipids in 6 months and he can follow up with Dr Mark Stafford then.   Mark Stafford KPA-C 09/22/2014 11:04 AM

## 2014-09-22 NOTE — Assessment & Plan Note (Signed)
1/2 PPD- currently enrolled in a stop smoking program

## 2014-10-19 ENCOUNTER — Other Ambulatory Visit: Payer: Self-pay | Admitting: *Deleted

## 2014-10-19 MED ORDER — PANTOPRAZOLE SODIUM 40 MG PO TBEC: DELAYED_RELEASE_TABLET | ORAL | Status: DC

## 2014-10-19 MED ORDER — ATORVASTATIN CALCIUM 80 MG PO TABS: ORAL_TABLET | ORAL | Status: DC

## 2014-10-19 NOTE — Telephone Encounter (Signed)
RX sent to patients pharmacy [facility]. 

## 2014-10-23 ENCOUNTER — Other Ambulatory Visit: Payer: Self-pay

## 2014-10-23 MED ORDER — ATORVASTATIN CALCIUM 80 MG PO TABS: ORAL_TABLET | ORAL | Status: DC

## 2014-10-23 MED ORDER — PANTOPRAZOLE SODIUM 40 MG PO TBEC: DELAYED_RELEASE_TABLET | ORAL | Status: DC

## 2014-10-23 NOTE — Telephone Encounter (Signed)
Rx sent to pharmacy   

## 2015-02-26 ENCOUNTER — Telehealth: Payer: Self-pay

## 2015-02-26 DIAGNOSIS — E785 Hyperlipidemia, unspecified: Secondary | ICD-10-CM

## 2015-02-26 NOTE — Telephone Encounter (Signed)
-----   Message from Neta EhlersAngela M Clancey Welton, CMA sent at 09/25/2014  7:25 AM EST ----- Regarding: release labs lipid

## 2015-02-26 NOTE — Telephone Encounter (Signed)
Labs released and lab slip mailed to patient

## 2015-03-20 LAB — LIPID PANEL
Cholesterol: 125 mg/dL (ref 0–200)
HDL: 43 mg/dL (ref >=40)
LDL Cholesterol: 57 mg/dL (ref 0–99)
Total CHOL/HDL Ratio: 2.9 Ratio
Triglycerides: 124 mg/dL (ref <150)
VLDL: 25 mg/dL (ref 0–40)

## 2015-03-22 ENCOUNTER — Encounter: Payer: Self-pay | Admitting: *Deleted

## 2015-04-19 ENCOUNTER — Other Ambulatory Visit: Payer: Self-pay | Admitting: Cardiovascular Disease

## 2015-04-19 NOTE — Telephone Encounter (Signed)
Rx has been sent to the pharmacy electronically. ° °

## 2015-06-12 ENCOUNTER — Other Ambulatory Visit: Payer: Self-pay | Admitting: Cardiovascular Disease

## 2015-08-12 ENCOUNTER — Other Ambulatory Visit: Payer: Self-pay | Admitting: Cardiovascular Disease

## 2015-08-14 ENCOUNTER — Other Ambulatory Visit: Payer: Self-pay | Admitting: Cardiovascular Disease

## 2015-08-14 MED ORDER — EZETIMIBE 10 MG PO TABS: 10.0000 mg | ORAL_TABLET | Freq: Every day | ORAL | Status: DC

## 2015-08-14 MED ORDER — NEBIVOLOL HCL 5 MG PO TABS: 5.0000 mg | ORAL_TABLET | Freq: Every day | ORAL | Status: DC

## 2015-08-14 MED ORDER — NIACIN ER (ANTIHYPERLIPIDEMIC) 1000 MG PO TBCR: 1000.0000 mg | EXTENDED_RELEASE_TABLET | Freq: Every day | ORAL | Status: DC

## 2015-08-14 NOTE — Telephone Encounter (Signed)
°*  STAT* If patient is at the pharmacy, call can be transferred to refill team.   1. Which medications need to be refilled? (please list name of each medication and dose if known) Bystolic 5mg   , Zeita 10mg  and Niastan 1000mg    2. Which pharmacy/location (including street and city if local pharmacy) is medication to be sent to?[facility]   3. Do they need a 30 day or 90 day supply? 90

## 2015-08-14 NOTE — Telephone Encounter (Signed)
Pt's medications has been sent to his pharmacy. Confirmation received

## 2015-09-14 ENCOUNTER — Ambulatory Visit (INDEPENDENT_AMBULATORY_CARE_PROVIDER_SITE_OTHER): Payer: 59 | Admitting: Cardiovascular Disease

## 2015-09-14 ENCOUNTER — Encounter: Payer: Self-pay | Admitting: Cardiovascular Disease

## 2015-09-14 VITALS — BP 120/78 | HR 66 | Ht 67.0 in | Wt 159.6 lb

## 2015-09-14 DIAGNOSIS — I10 Essential (primary) hypertension: Secondary | ICD-10-CM

## 2015-09-14 DIAGNOSIS — I2510 Atherosclerotic heart disease of native coronary artery without angina pectoris: Secondary | ICD-10-CM | POA: Diagnosis not present

## 2015-09-14 DIAGNOSIS — E785 Hyperlipidemia, unspecified: Secondary | ICD-10-CM | POA: Diagnosis not present

## 2015-09-14 DIAGNOSIS — Z9861 Coronary angioplasty status: Secondary | ICD-10-CM | POA: Diagnosis not present

## 2015-09-14 MED ORDER — NEBIVOLOL HCL 5 MG PO TABS: 5.0000 mg | ORAL_TABLET | Freq: Every day | ORAL | Status: DC

## 2015-09-14 MED ORDER — PANTOPRAZOLE SODIUM 40 MG PO TBEC: DELAYED_RELEASE_TABLET | ORAL | Status: DC

## 2015-09-14 MED ORDER — ATORVASTATIN CALCIUM 80 MG PO TABS: ORAL_TABLET | ORAL | Status: DC

## 2015-09-14 MED ORDER — EZETIMIBE 10 MG PO TABS: 10.0000 mg | ORAL_TABLET | Freq: Every day | ORAL | Status: DC

## 2015-09-14 NOTE — Progress Notes (Signed)
09/14/2015 Lise Auer   04-04-1966  [id number]  Primary Physician Lovenia Kim, PA-C Primary Cardiologist: Runell Gess MD Roseanne Reno   HPI:  Mr. Cubit is a delightful 49 year old thin and fit appearing married Caucasian male father of 2 children he was formally a patient of Dr. Alanda Amass. I last saw him in the office 12/20/13. His primary care provider is Lovenia Kim [facility]. He has a history of CAD status post anterior wall myocardial infarction 08/19/03. He underwent cardiac catheterization by Dr. Alanda Amass revealing an occluded apical LAD which underwent angioplasty. He had a left dominant system with scattered 30-40% stenoses otherwise. His EF was 50% with apical hypokinesia at that time. This has since improved to normal. A Myoview stress test performed in 2001 and showed no ischemia or infarct. He is completely asymptomatic. 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 He is treated hypertension and hyperlipidemia   Current Outpatient Prescriptions  Medication Sig Dispense Refill  . aspirin 81 MG tablet Take 81 mg by mouth daily.    Marland Kitchen atorvastatin (LIPITOR) 80 MG tablet TAKE 1 BY MOUTH DAILY 90 tablet 1  . ezetimibe (ZETIA) 10 MG tablet Take 1 tablet (10 mg total) by mouth daily. Need appointment before anymore refills 90 tablet 0  . nebivolol (BYSTOLIC) 5 MG tablet Take 1 tablet (5 mg total) by mouth daily. Need appointment before anymore refills 90 tablet 0  . niacin (NIASPAN) 1000 MG CR tablet Take 1 tablet (1,000 mg total) by mouth at bedtime. Need appointment before anymore refills 90 tablet 0  . pantoprazole (PROTONIX) 40 MG tablet Take 1 tablet by mouth  daily 90 tablet 0  . traMADol (ULTRAM) 50 MG tablet Take 50 mg by mouth every 6 (six) hours as needed. for pain  1  . sertraline (ZOLOFT) 25 MG tablet Take 25 mg by mouth  daily. Take 1 tab daily  2   No current facility-administered medications for this visit.    No Known Allergies  Social History   Social History  . Marital Status: Married    Spouse Name: N/A  . Number of Children: N/A  . Years of Education: N/A   Occupational History  . Not on file.   Social History Main Topics  . Smoking status: Current Every Day Smoker -- 0.25 packs/day    Types: Cigarettes  . Smokeless tobacco: Not on file  . Alcohol Use: Not on file  . Drug Use: Not on file  . Sexual Activity: Not on file   Other Topics Concern  . Not on file   Social History Narrative     Review of Systems: General: negative for chills, fever, night sweats or weight changes.  Cardiovascular: negative for chest pain, dyspnea on exertion, edema, orthopnea, palpitations, paroxysmal nocturnal dyspnea or shortness of breath Dermatological: negative for rash Respiratory: negative for cough or wheezing Urologic: negative for hematuria Abdominal: negative for nausea, vomiting, diarrhea, bright red blood per rectum, melena, or hematemesis Neurologic: negative for visual changes, syncope, or dizziness All other systems reviewed and are otherwise negative except as noted above.    Blood pressure 120/78, pulse 66, height  (1.702 m), weight 159 lb 9 oz (72.377 kg).  General appearance: alert and no distress Neck: no adenopathy, no carotid bruit, no JVD, supple, symmetrical, trachea midline and thyroid not enlarged, symmetric, no tenderness/mass/nodules Lungs:  clear to auscultation bilaterally Heart: regular rate and rhythm, S1, S2 normal, no murmur, click, rub or gallop Extremities: extremities normal, atraumatic, no cyanosis or edema  EKG normal sinus rhythm at 66 without ST or T-wave changes. I personally reviewed this EKG  ASSESSMENT AND PLAN:   Tobacco abuse Long history of tobacco abuse recalcitrant risk factor modification. Smoking 5 cigarettes a  day  Hyperlipidemia History of hyperlipidemia on statin therapy and Zetia along with Niaspan with recent lipid profile performed 03/19/15 revealed a total cholesterol of 125, LDL 57 and HDL of 43  Essential hypertension History of hypertension blood pressure measured today at 120/78. He is on Bystolic . Continue current meds at current dosing  CAD S/P percutaneous coronary angioplasty History of CAD status post anterior wall microinfarction 08/19/03. He underwent intervention by Dr. Alanda AmassWeintraub revealing an occluded apical LAD which was angioplastied. He left dominant system with scattered 30-40% stenoses otherwise. His EF was 50% with apical hypokinesia at that time. His last Myoview performed 10/24/09 was low risk. He denies chest pain or shortness of breath.      Runell GessJonathan J. Laquan Beier MD FACP,FACC,FAHA, [facility] 09/14/2015 7:49 AM

## 2015-09-14 NOTE — Assessment & Plan Note (Signed)
History of CAD status post anterior wall microinfarction 08/19/03. He underwent intervention by Dr. Alanda AmassWeintraub revealing an occluded apical LAD which was angioplastied. He left dominant system with scattered 30-40% stenoses otherwise. His EF was 50% with apical hypokinesia at that time. His last Myoview performed 10/24/09 was low risk. He denies chest pain or shortness of breath.

## 2015-09-14 NOTE — Assessment & Plan Note (Signed)
Long history of tobacco abuse recalcitrant risk factor modification. Smoking 5 cigarettes a day

## 2015-09-14 NOTE — Assessment & Plan Note (Signed)
History of hyperlipidemia on statin therapy and Zetia along with Niaspan with recent lipid profile performed 03/19/15 revealed a total cholesterol of 125, LDL 57 and HDL of 43

## 2015-09-14 NOTE — Patient Instructions (Signed)
Medication Instructions:  Your physician recommends that you continue on your current medications as directed. Please refer to the Current Medication list given to you today.   Labwork: Your physician recommends that you return for lab work in: FASTING (Lipid/Liver) - May 2017 The lab can be found on the FIRST FLOOR of out building in [address]   Testing/Procedures: none  Follow-Up: Your physician wants you to follow-up in: 12 months with Dr. Allyson SabalBerry. You will receive a reminder letter in the mail two months in advance. If you don't receive a letter, please call our office to schedule the follow-up appointment.   Any Other Special Instructions Will Be Listed Below (If Applicable).     If you need a refill on your cardiac medications before your next appointment, please call your pharmacy.

## 2015-09-14 NOTE — Assessment & Plan Note (Signed)
History of hypertension blood pressure measured today at 120/78. He is on Bystolic . Continue current meds at current dosing

## 2015-10-20 ENCOUNTER — Other Ambulatory Visit: Payer: Self-pay | Admitting: Cardiovascular Disease

## 2015-12-20 ENCOUNTER — Telehealth: Payer: Self-pay | Admitting: Cardiovascular Disease

## 2015-12-20 DIAGNOSIS — E785 Hyperlipidemia, unspecified: Secondary | ICD-10-CM

## 2015-12-20 NOTE — Telephone Encounter (Signed)
Returned call to patient.No answer.Left message on personal voice mail no replacement for Zetia.Advised [facility].

## 2015-12-20 NOTE — Telephone Encounter (Signed)
We can check FLP off of Zetia and see

## 2015-12-20 NOTE — Telephone Encounter (Signed)
Returned call to patient no answer.Left message on personal voice mail Dr.Berry's recommendations.Lab order mailed to patient to have fasting lipid panel.

## 2015-12-20 NOTE — Telephone Encounter (Signed)
Returned call to patient no answer.L

## 2015-12-20 NOTE — Telephone Encounter (Signed)
New message       Pt states his [employer] will not pay for zetia.  He needs a prior authorization or switch to something else.  Please call him and let him know what to do.  He took his last pill this am.

## 2016-01-14 ENCOUNTER — Telehealth: Payer: Self-pay | Admitting: *Deleted

## 2016-01-14 LAB — LIPID PANEL
CHOL/HDL RATIO: 3.8 ratio (ref <=5.0)
Cholesterol: 155 mg/dL (ref 125–200)
HDL: 41 mg/dL (ref >=40)
LDL CALC: 82 mg/dL (ref <130)
Triglycerides: 161 mg/dL — ABNORMAL HIGH (ref <150)
VLDL: 32 mg/dL — AB (ref <30)

## 2016-01-14 LAB — HEPATIC FUNCTION PANEL
ALT: 24 U/L (ref 9–46)
AST: 22 U/L (ref 10–40)
Albumin: 4.6 g/dL (ref 3.6–5.1)
Alkaline Phosphatase: 64 U/L (ref 40–115)
BILIRUBIN TOTAL: 0.4 mg/dL (ref 0.2–1.2)
Bilirubin, Direct: 0.1 mg/dL (ref <=0.2)
Indirect Bilirubin: 0.3 mg/dL (ref 0.2–1.2)
TOTAL PROTEIN: 7.4 g/dL (ref 6.1–8.1)

## 2016-01-14 NOTE — Telephone Encounter (Signed)
Patient wants to know if he should stay on Zetia or is there a cheaper alternative.  Would like a call back once decision is made.

## 2016-01-15 NOTE — Telephone Encounter (Signed)
Left detailed message on pt home phone, okay per DPR, that he can stop Zetia and needs to increase fruits/vegetables in diet. Gave numbers to call back if has questions

## 2016-01-15 NOTE — Telephone Encounter (Signed)
Based on most recent labs, can d/c ezetimibe.  Continue with niacin and atorvastatin.  Triglycerides are starting to creep up, would remind patient to watch carbs and eat more fruits/vegetables.

## 2016-08-08 ENCOUNTER — Other Ambulatory Visit: Payer: Self-pay | Admitting: Cardiovascular Disease

## 2016-09-10 ENCOUNTER — Ambulatory Visit: Payer: 59 | Admitting: Cardiovascular Disease

## 2016-10-01 ENCOUNTER — Other Ambulatory Visit: Payer: Self-pay | Admitting: Cardiovascular Disease

## 2016-10-01 NOTE — Telephone Encounter (Signed)
Overdue for follow up appt.

## 2016-11-17 DIAGNOSIS — M791 Myalgia: Secondary | ICD-10-CM | POA: Diagnosis not present

## 2016-11-17 DIAGNOSIS — M545 Low back pain: Secondary | ICD-10-CM | POA: Diagnosis not present

## 2016-12-18 ENCOUNTER — Other Ambulatory Visit: Payer: Self-pay | Admitting: Cardiovascular Disease

## 2017-02-10 ENCOUNTER — Other Ambulatory Visit: Payer: Self-pay | Admitting: Cardiovascular Disease

## 2017-02-10 NOTE — Telephone Encounter (Signed)
Rx(s) sent to pharmacy electronically.  

## 2017-04-24 ENCOUNTER — Telehealth: Payer: Self-pay

## 2017-04-24 MED ORDER — PANTOPRAZOLE SODIUM 40 MG PO TBEC: 40.0000 mg | DELAYED_RELEASE_TABLET | Freq: Every day | ORAL | 0 refills | Status: DC

## 2017-04-24 MED ORDER — NIACIN ER (ANTIHYPERLIPIDEMIC) 1000 MG PO TBCR: 1000.0000 mg | EXTENDED_RELEASE_TABLET | Freq: Every day | ORAL | 0 refills | Status: DC

## 2017-04-24 MED ORDER — NEBIVOLOL HCL 5 MG PO TABS: 5.0000 mg | ORAL_TABLET | Freq: Every day | ORAL | 0 refills | Status: DC

## 2017-04-24 MED ORDER — ATORVASTATIN CALCIUM 80 MG PO TABS: 80.0000 mg | ORAL_TABLET | Freq: Every day | ORAL | 0 refills | Status: DC

## 2017-04-24 NOTE — Telephone Encounter (Signed)
Refill request from [facility] via fax. Patient has not been seen since 09/2015. Patient is pass due for a yearly exam with physician.   I will refill prescription for 30 day supply and will contact the patient about making an appointment.

## 2017-04-24 NOTE — Telephone Encounter (Signed)
Refill request from [facility] via fax. Patient has not been seen since 09/2015. Patient is pass due for a yearly exam with physician.   I will refill all 3 prescriptions for 30 day supple and will contact the patient about making an appointment.

## 2017-04-28 ENCOUNTER — Other Ambulatory Visit: Payer: Self-pay

## 2017-04-28 MED ORDER — NIACIN ER (ANTIHYPERLIPIDEMIC) 1000 MG PO TBCR: 1000.0000 mg | EXTENDED_RELEASE_TABLET | Freq: Every day | ORAL | 0 refills | Status: DC

## 2017-04-28 MED ORDER — PANTOPRAZOLE SODIUM 40 MG PO TBEC: 40.0000 mg | DELAYED_RELEASE_TABLET | Freq: Every day | ORAL | 0 refills | Status: DC

## 2017-04-28 MED ORDER — ATORVASTATIN CALCIUM 80 MG PO TABS: 80.0000 mg | ORAL_TABLET | Freq: Every day | ORAL | 0 refills | Status: DC

## 2017-04-28 MED ORDER — NEBIVOLOL HCL 5 MG PO TABS: 5.0000 mg | ORAL_TABLET | Freq: Every day | ORAL | 0 refills | Status: DC

## 2017-05-27 DIAGNOSIS — I10 Essential (primary) hypertension: Secondary | ICD-10-CM | POA: Diagnosis not present

## 2017-05-27 DIAGNOSIS — M545 Low back pain: Secondary | ICD-10-CM | POA: Diagnosis not present

## 2017-05-27 DIAGNOSIS — E782 Mixed hyperlipidemia: Secondary | ICD-10-CM | POA: Diagnosis not present

## 2017-06-04 ENCOUNTER — Telehealth: Payer: Self-pay | Admitting: Cardiovascular Disease

## 2017-06-04 NOTE — Telephone Encounter (Signed)
Closed Encounter  °

## 2017-06-10 ENCOUNTER — Ambulatory Visit (INDEPENDENT_AMBULATORY_CARE_PROVIDER_SITE_OTHER): Payer: 59 | Admitting: Pharmacist

## 2017-06-10 DIAGNOSIS — E785 Hyperlipidemia, unspecified: Secondary | ICD-10-CM | POA: Diagnosis not present

## 2017-06-10 NOTE — Patient Instructions (Addendum)
Lipid Clinic (pharmacist) Jaciel Diem/Kristin  *Start lifestyle modifications including diet, exercise and smoking cessation  *Take all medication as prescribed*  *Repeat FASTING blood work in 8 weeks*   Cholesterol Cholesterol is a fat. Your body needs a small amount of cholesterol. Cholesterol (plaque) may build up in your blood vessels (arteries). That makes you more likely to have a heart attack or stroke. You cannot feel your cholesterol level. Having a blood test is the only way to find out if your level is high. Keep your test results. Work with your doctor to keep your cholesterol at a good level. What do the results mean?  Total cholesterol is how much cholesterol is in your blood.  LDL is bad cholesterol. This is the type that can build up. Try to have low LDL.  HDL is good cholesterol. It cleans your blood vessels and carries LDL away. Try to have high HDL.  Triglycerides are fat that the body can store or burn for energy. What are good levels of cholesterol?  Total cholesterol below 200.  LDL below 100 is good for people who have health risks. LDL below 70 is good for people who have very high risks.  HDL above 40 is good. It is best to have HDL of 60 or higher.  Triglycerides below 150. How can I lower my cholesterol? Diet Follow your diet program as told by your doctor.  Choose fish, white meat chicken, or [place]key that is roasted or baked. Try not to eat red meat, fried foods, sausage, or lunch meats.  Eat lots of fresh fruits and vegetables.  Choose whole grains, beans, pasta, potatoes, and cereals.  Choose olive oil, corn oil, or canola oil. Only use small amounts.  Try not to eat butter, mayonnaise, shortening, or palm kernel oils.  Try not to eat foods with trans fats.  Choose low-fat or nonfat dairy foods. ? Drink skim or nonfat milk. ? Eat low-fat or nonfat yogurt and cheeses. ? Try not to drink whole milk or cream. ? Try not to eat ice cream, egg  yolks, or full-fat cheeses.  Healthy desserts include angel food cake, ginger snaps, animal crackers, hard candy, popsicles, and low-fat or nonfat frozen yogurt. Try not to eat pastries, cakes, pies, and cookies.  Exercise Follow your exercise program as told by your doctor.  Be more active. Try gardening, walking, and taking the stairs.  Ask your doctor about ways that you can be more active.  Medicine  Take over-the-counter and prescription medicines only as told by your doctor.  This information is not intended to replace advice given to you by your health care provider. Make sure you discuss any questions you have with your health care provider. Document Released: 12/19/2008 Document Revised: 04/23/2016 Document Reviewed: 04/03/2016 Elsevier Interactive Patient Education  2017 [employer]

## 2017-06-10 NOTE — Assessment & Plan Note (Addendum)
LDL above goal of 70mg /dL for secondary prevention. Patient current therapy includes atorvastatin 80mg  and niacin. He denies ADR or problems with current medication but admits poor compliance with his atorvastatin and not taking ezetimibe for last month.  Repatha/Praluent indication, dose, administration, cost, pre-approval and common side effect were discussed during this appointment. Patient refuses PCSK9 inhibitors today and will like to go back to previous therapy and lifestyle modification before start "injecting himself".  Will resume frequent physical activity, increase plant base food in diet, resume ezetimibe 10mg  daily and improve compliance with atorvastatin and niacin. Plan to repeat Lipid panel in 8 weeks to assess changes. Patient is NOT ready to quit smoking at this time but will "think about" Chantix therapy in the future. If LDL not at goal in 8 weeks, patient willing to consider PCSK9i initiation.

## 2017-06-10 NOTE — Progress Notes (Signed)
Patient ID: Mark Stafford                 DOB: 07/01/1966                    MRN: [id number]     HPI: Mark Stafford is a 51 y.o. male patient referred to lipid clinic by Dr Allyson SabalBerry. PMH is significant for CAD post MI and angioplasty in 08/19/2003 , hypertension, hyperlipidemia and tobacco abuse. Denies problems with any medication but admits poor compliance with his current therapy.Presents to lipid clinic today for PCSK9i assessment and potential initiation.  Current Medications:  Atorvastatin 80mg  daily - misses few doses every week (usually on days off work) Niacin 1000mg  daily Ezetimibe 10mg  - stopped taking "sometime ago"  Intolerances: none  LDL goal: 70mg /dL  Diet: eats red meat 2-3 times per week; not following any specific diet  Exercise: golf 2-3 times per week, no additional physical activity. Stopped going to gym sometime ago  Family History: denies history of heart disease or cardiac complication on parents or grandparents  Social History: current smoker of 1 pack of cigarettes per day, social alcohol (6 pack per week)  Labs: CHO 193; TG 253; HDL 38; LDL 105 (05/27/2017)  Past Medical History:  Diagnosis Date  . Coronary artery disease    PCI LAD 2004  . Hyperlipidemia   . Hypertension   . Tobacco abuse     Current Outpatient Prescriptions on File Prior to Visit  Medication Sig Dispense Refill  . aspirin 81 MG tablet Take 81 mg by mouth daily.    Marland Kitchen. atorvastatin (LIPITOR) 80 MG tablet Take 1 tablet (80 mg total) by mouth daily. 90 tablet 0  . nebivolol (BYSTOLIC) 5 MG tablet Take 1 tablet (5 mg total) by mouth daily. 90 tablet 0  . niacin (NIASPAN) 1000 MG CR tablet Take 1 tablet (1,000 mg total) by mouth at bedtime. <PLEASE MAKE APPOINTMENT FOR REFILLS> 90 tablet 0  . pantoprazole (PROTONIX) 40 MG tablet Take 1 tablet (40 mg total) by mouth daily. <PLEASE MAKE APPOINTMENT FOR REFILLS> 90 tablet 0  . sertraline (ZOLOFT) 25 MG tablet Take 25 mg by mouth daily. Take 1  tab daily  2  . traMADol (ULTRAM) 50 MG tablet Take 50 mg by mouth every 6 (six) hours as needed. for pain  1   No current facility-administered medications on file prior to visit.     No Known Allergies  Hyperlipidemia LDL above goal of 70mg /dL for secondary prevention. Patient current therapy includes atorvastatin 80mg  and niacin. He denies ADR or problems with current medication but admits poor compliance with his atorvastatin and not taking ezetimibe for last month.  Repatha/Praluent indication, dose, administration, cost, pre-approval and common side effect were discussed during this appointment. Patient refuses PCSK9 inhibitors today and will like to go back to previous therapy and lifestyle modification before start "injecting himself".  Will resume frequent physical activity, increase plant base food in diet, resume ezetimibe 10mg  daily and improve compliance with atorvastatin and niacin. Plan to repeat Lipid panel in 8 weeks to assess changes. Patient is NOT ready to quit smoking at this time but will "think about" Chantix therapy in the future. If LDL not at goal in 8 weeks, patient willing to consider PCSK9i initiation.   Miking Usrey Rodriguez-Guzman PharmD, BCPS, CPP [facility] [address] [place] [phone] 06/10/2017 8:56 PM

## 2017-07-09 ENCOUNTER — Other Ambulatory Visit: Payer: Self-pay | Admitting: Cardiovascular Disease

## 2017-07-09 NOTE — Telephone Encounter (Signed)
REFILL 

## 2017-08-25 ENCOUNTER — Telehealth: Payer: Self-pay | Admitting: Pharmacist

## 2017-08-25 DIAGNOSIS — E785 Hyperlipidemia, unspecified: Secondary | ICD-10-CM

## 2017-08-25 NOTE — Telephone Encounter (Signed)
LMOM;    Patient due to repeat Lipid panel. If LDL remains > 70mg /dL we will submit paperwork for Repatha/Praluent.  Order for lipid panel and LFTs in epic

## 2017-09-03 ENCOUNTER — Other Ambulatory Visit: Payer: Self-pay

## 2017-09-03 DIAGNOSIS — E785 Hyperlipidemia, unspecified: Secondary | ICD-10-CM | POA: Diagnosis not present

## 2017-09-03 LAB — HEPATIC FUNCTION PANEL
ALK PHOS: 72 IU/L (ref 39–117)
ALT: 22 IU/L (ref 0–44)
AST: 23 IU/L (ref 0–40)
Albumin: 4.6 g/dL (ref 3.5–5.5)
BILIRUBIN TOTAL: 0.3 mg/dL (ref 0.0–1.2)
BILIRUBIN, DIRECT: 0.11 mg/dL (ref 0.00–0.40)
TOTAL PROTEIN: 7.3 g/dL (ref 6.0–8.5)

## 2017-09-03 LAB — LIPID PANEL
CHOL/HDL RATIO: 2.9 ratio (ref 0.0–5.0)
Cholesterol, Total: 112 mg/dL (ref 100–199)
HDL: 38 mg/dL — ABNORMAL LOW (ref >39)
LDL CALC: 52 mg/dL (ref 0–99)
Triglycerides: 108 mg/dL (ref 0–149)
VLDL CHOLESTEROL CAL: 22 mg/dL (ref 5–40)

## 2017-10-01 ENCOUNTER — Other Ambulatory Visit: Payer: Self-pay

## 2017-10-01 MED ORDER — NIACIN ER (ANTIHYPERLIPIDEMIC) 1000 MG PO TBCR: 1000.0000 mg | EXTENDED_RELEASE_TABLET | Freq: Every day | ORAL | 0 refills | Status: DC

## 2017-10-01 MED ORDER — PANTOPRAZOLE SODIUM 40 MG PO TBEC: 40.0000 mg | DELAYED_RELEASE_TABLET | Freq: Every day | ORAL | 0 refills | Status: DC

## 2017-10-01 MED ORDER — NEBIVOLOL HCL 5 MG PO TABS: 5.0000 mg | ORAL_TABLET | Freq: Every day | ORAL | 0 refills | Status: DC

## 2017-10-01 MED ORDER — ATORVASTATIN CALCIUM 80 MG PO TABS: 80.0000 mg | ORAL_TABLET | Freq: Every day | ORAL | 0 refills | Status: DC

## 2018-01-26 DIAGNOSIS — Z0000 Encounter for general adult medical examination without abnormal findings: Secondary | ICD-10-CM | POA: Diagnosis not present

## 2018-01-26 DIAGNOSIS — E782 Mixed hyperlipidemia: Secondary | ICD-10-CM | POA: Diagnosis not present

## 2018-04-25 ENCOUNTER — Other Ambulatory Visit: Payer: Self-pay | Admitting: Cardiovascular Disease

## 2018-06-09 ENCOUNTER — Other Ambulatory Visit: Payer: Self-pay | Admitting: Cardiovascular Disease

## 2018-07-07 ENCOUNTER — Other Ambulatory Visit: Payer: Self-pay | Admitting: Cardiovascular Disease

## 2018-07-17 DIAGNOSIS — Z23 Encounter for immunization: Secondary | ICD-10-CM | POA: Diagnosis not present

## 2018-07-30 ENCOUNTER — Other Ambulatory Visit: Payer: Self-pay | Admitting: *Deleted

## 2018-07-30 MED ORDER — PANTOPRAZOLE SODIUM 40 MG PO TBEC: 40.0000 mg | DELAYED_RELEASE_TABLET | Freq: Every day | ORAL | 0 refills | Status: DC

## 2018-08-02 ENCOUNTER — Other Ambulatory Visit: Payer: Self-pay | Admitting: Cardiovascular Disease

## 2018-08-02 NOTE — Telephone Encounter (Signed)
Rx request sent to pharmacy.  

## 2018-09-22 ENCOUNTER — Other Ambulatory Visit: Payer: Self-pay | Admitting: Cardiovascular Disease

## 2018-09-30 ENCOUNTER — Encounter (INDEPENDENT_AMBULATORY_CARE_PROVIDER_SITE_OTHER): Payer: Self-pay

## 2018-09-30 ENCOUNTER — Ambulatory Visit (INDEPENDENT_AMBULATORY_CARE_PROVIDER_SITE_OTHER): Payer: 59 | Admitting: Cardiovascular Disease

## 2018-09-30 ENCOUNTER — Encounter: Payer: Self-pay | Admitting: Cardiovascular Disease

## 2018-09-30 VITALS — BP 118/70 | HR 82 | Ht 67.0 in | Wt 160.0 lb

## 2018-09-30 DIAGNOSIS — Z9861 Coronary angioplasty status: Secondary | ICD-10-CM | POA: Diagnosis not present

## 2018-09-30 DIAGNOSIS — I10 Essential (primary) hypertension: Secondary | ICD-10-CM | POA: Diagnosis not present

## 2018-09-30 DIAGNOSIS — I2510 Atherosclerotic heart disease of native coronary artery without angina pectoris: Secondary | ICD-10-CM

## 2018-09-30 MED ORDER — NEBIVOLOL HCL 5 MG PO TABS: 5.0000 mg | ORAL_TABLET | Freq: Every day | ORAL | 3 refills | Status: DC

## 2018-09-30 MED ORDER — ASPIRIN 81 MG PO TABS: 81.0000 mg | ORAL_TABLET | Freq: Every day | ORAL | 3 refills | Status: DC

## 2018-09-30 MED ORDER — ATORVASTATIN CALCIUM 80 MG PO TABS: 80.0000 mg | ORAL_TABLET | Freq: Every day | ORAL | 3 refills | Status: DC

## 2018-09-30 MED ORDER — EZETIMIBE 10 MG PO TABS: 10.0000 mg | ORAL_TABLET | Freq: Every day | ORAL | 3 refills | Status: DC

## 2018-09-30 NOTE — Patient Instructions (Signed)
Medication Instructions:  Your physician recommends that you continue on your current medications as directed. Please refer to the Current Medication list given to you today.  If you need a refill on your cardiac medications before your next appointment, please call your pharmacy.   Lab work: NONE If you have labs (blood work) drawn today and your tests are completely normal, you will receive your results only by: . MyChart Message (if you have MyChart) OR . A paper copy in the mail If you have any lab test that is abnormal or we need to change your treatment, we will call you to review the results.  Testing/Procedures: NONE  Follow-Up: At [facility], you and your health needs are our priority.  As part of our continuing mission to provide you with exceptional heart care, we have created designated Provider Care Teams.  These Care Teams include your primary Cardiologist (physician) and Advanced Practice Providers (APPs -  Physician Assistants and Nurse Practitioners) who all work together to provide you with the care you need, when you need it. You will need a follow up appointment in 12 months.  Please call our office 2 months in advance to schedule this appointment.  You may see DR. BERRY or one of the following Advanced Practice Providers on your designated Care Team:   Luke Kilroy, PA-C Krista Kroeger, PA-C . Callie Goodrich, PA-C    

## 2018-09-30 NOTE — Assessment & Plan Note (Signed)
History of ongoing tobacco abuse of 5 cigarettes a day recalcitrant to risk factor modification.

## 2018-09-30 NOTE — Assessment & Plan Note (Signed)
History of essential hypertension blood pressure measured today at 118/70.  He is on Bystolic.  Continue current meds at current dosing.

## 2018-09-30 NOTE — Progress Notes (Signed)
09/30/2018 Lise AuerJerry D Lotspeich   04/15/1966  [id number]  Primary Physician Lovenia KimHepler, Mark, PA-C Primary Cardiologist: Runell GessJonathan J Lenford Beddow MD Milagros LollFACP, FACC, [place]HA, [place]  HPI:  Lise AuerJerry D Stafford is a 52 y.o.  thin and fit appearing married Caucasian male father of 2 children he was formally a patient of Dr. Alanda AmassWeintraub. I last saw him in the office 09/14/2015. His primary care provider is [facility]. He has a history of CAD status post anterior wall myocardial infarction 08/19/03. He underwent cardiac catheterization by Dr. Alanda AmassWeintraub revealing an occluded apical LAD which underwent angioplasty. He had a left dominant system with scattered 30-40% stenoses otherwise. His EF was 50% with apical hypokinesia at that time. This has since improved to normal. A Myoview stress test performed in 10/24/2009 and showed no ischemia or infarct.   Since I saw him 3 years ago he is remained asymptomatic.  He no longer works at [employer] part-time but works full-time at [employer] .   Current Meds  Medication Sig  . aspirin 81 MG tablet Take 81 mg by mouth daily.  Marland Kitchen. atorvastatin (LIPITOR) 80 MG tablet TAKE 1 TABLET BY MOUTH  DAILY  . BYSTOLIC 5 MG tablet TAKE 1 TABLET BY MOUTH  DAILY  . ezetimibe (ZETIA) 10 MG tablet Take 10 mg by mouth daily.  . niacin (NIASPAN) 1000 MG CR tablet Take 1 tablet (1,000 mg total) by mouth at bedtime. NO FURTHER REFILLS  . pantoprazole (PROTONIX) 40 MG tablet Take 1 tablet (40 mg total) by mouth daily. Keep upcoming OV for continuation of refills  . sertraline (ZOLOFT) 25 MG tablet Take 25 mg by mouth daily. Take 1 tab daily  . traMADol (ULTRAM) 50 MG tablet Take 50 mg by mouth every 6 (six) hours as needed. for pain     No Known Allergies  Social History   Socioeconomic History  . Marital status: Married    Spouse name: Not on file  . Number of children: Not on file  . Years of education: Not on file  . Highest education level: Not on file  Occupational History  . Not on file    Social Needs  . Financial resource strain: Not on file  . Food insecurity:    Worry: Not on file    Inability: Not on file  . Transportation needs:    Medical: Not on file    Non-medical: Not on file  Tobacco Use  . Smoking status: Current Every Day Smoker    Packs/day: 0.25    Types: Cigarettes  . Smokeless tobacco: Never Used  Substance and Sexual Activity  . Alcohol use: Not on file  . Drug use: Not on file  . Sexual activity: Not on file  Lifestyle  . Physical activity:    Days per week: Not on file    Minutes per session: Not on file  . Stress: Not on file  Relationships  . Social connections:    Talks on phone: Not on file    Gets together: Not on file    Attends religious service: Not on file    Active member of club or organization: Not on file    Attends meetings of clubs or organizations: Not on file    Relationship status: Not on file  . Intimate partner violence:    Fear of current or ex partner: Not on file    Emotionally abused: Not on file    Physically abused: Not on file  Forced sexual activity: Not on file  Other Topics Concern  . Not on file  Social History Narrative  . Not on file     Review of Systems: General: negative for chills, fever, night sweats or weight changes.  Cardiovascular: negative for chest pain, dyspnea on exertion, edema, orthopnea, palpitations, paroxysmal nocturnal dyspnea or shortness of breath Dermatological: negative for rash Respiratory: negative for cough or wheezing Urologic: negative for hematuria Abdominal: negative for nausea, vomiting, diarrhea, bright red blood per rectum, melena, or hematemesis Neurologic: negative for visual changes, syncope, or dizziness All other systems reviewed and are otherwise negative except as noted above.    Blood pressure 118/70, pulse 82, height 5\' 7"  (1.702 m), weight 160 lb (72.6 kg).  General appearance: alert and no distress Neck: no adenopathy, no carotid bruit, no JVD,  supple, symmetrical, trachea midline and thyroid not enlarged, symmetric, no tenderness/mass/nodules Lungs: clear to auscultation bilaterally Heart: regular rate and rhythm, S1, S2 normal, no murmur, click, rub or gallop Extremities: extremities normal, atraumatic, no cyanosis or edema Pulses: 2+ and symmetric Skin: Skin color, texture, turgor normal. No rashes or lesions Neurologic: Alert and oriented X 3, normal strength and tone. Normal symmetric reflexes. Normal coordination and gait  EKG sinus rhythm at 82 with septal Q waves. I  Personally reviewed this EKG.  ASSESSMENT AND PLAN:   CAD S/P percutaneous coronary angioplasty History of CAD status post cardiac catheterization after an anterior wall myocardial infarction 08/19/2003 revealing occluded apical LAD which underwent angioplasty.  He did have a left dominant system with scattered 30 to 40% 40% stenoses otherwise.  His EF was 50% at that time with anteroapical hypokinesia which improved subsequently.  His last Myoview performed 10/24/2009 was nonischemic.  He denies chest pain or shortness of breath.  Essential hypertension History of essential hypertension blood pressure measured today at 118/70.  He is on Bystolic.  Continue current meds at current dosing.  Hyperlipidemia History of hyperlipidemia on atorvastatin with lipid profile performed 01/26/2018 revealing total cholesterol 113, LDL 53 and HDL 40.  Tobacco abuse History of ongoing tobacco abuse of 5 cigarettes a day recalcitrant to risk factor modification.      Runell GessJonathan J. Tung Pustejovsky MD FACP,FACC,FAHA, [facility] Jersey[facility]SCAI 09/30/2018 8:15 AM

## 2018-09-30 NOTE — Assessment & Plan Note (Signed)
History of CAD status post cardiac catheterization after an anterior wall myocardial infarction 08/19/2003 revealing occluded apical LAD which underwent angioplasty.  He did have a left dominant system with scattered 30 to 40% 40% stenoses otherwise.  His EF was 50% at that time with anteroapical hypokinesia which improved subsequently.  His last Myoview performed 10/24/2009 was nonischemic.  He denies chest pain or shortness of breath.

## 2018-09-30 NOTE — Assessment & Plan Note (Signed)
History of hyperlipidemia on atorvastatin with lipid profile performed 01/26/2018 revealing total cholesterol 113, LDL 53 and HDL 40.

## 2018-10-07 DIAGNOSIS — Z720 Tobacco use: Secondary | ICD-10-CM | POA: Diagnosis not present

## 2018-10-07 DIAGNOSIS — M545 Low back pain: Secondary | ICD-10-CM | POA: Diagnosis not present

## 2018-10-13 ENCOUNTER — Telehealth: Payer: Self-pay | Admitting: Cardiovascular Disease

## 2018-10-13 MED ORDER — NIACIN ER (ANTIHYPERLIPIDEMIC) 1000 MG PO TBCR: 1000.0000 mg | EXTENDED_RELEASE_TABLET | Freq: Every day | ORAL | 3 refills | Status: DC

## 2018-10-13 NOTE — Telephone Encounter (Signed)
Refill sent to the pharmacy electronically.  

## 2018-10-13 NOTE — Telephone Encounter (Signed)
Pt calling to requesting a refill on niacin sent to [facility] on [address]. Please address

## 2019-01-28 ENCOUNTER — Other Ambulatory Visit: Payer: Self-pay | Admitting: Cardiovascular Disease

## 2019-02-11 DIAGNOSIS — M545 Low back pain: Secondary | ICD-10-CM | POA: Diagnosis not present

## 2019-02-11 DIAGNOSIS — K648 Other hemorrhoids: Secondary | ICD-10-CM | POA: Diagnosis not present

## 2019-02-11 DIAGNOSIS — I10 Essential (primary) hypertension: Secondary | ICD-10-CM | POA: Diagnosis not present

## 2019-02-25 DIAGNOSIS — R6889 Other general symptoms and signs: Secondary | ICD-10-CM | POA: Diagnosis not present

## 2019-05-23 ENCOUNTER — Other Ambulatory Visit: Payer: Self-pay | Admitting: Cardiovascular Disease

## 2019-07-24 ENCOUNTER — Other Ambulatory Visit: Payer: Self-pay | Admitting: Cardiovascular Disease

## 2019-08-19 ENCOUNTER — Other Ambulatory Visit: Payer: Self-pay | Admitting: Cardiovascular Disease

## 2019-09-10 ENCOUNTER — Other Ambulatory Visit: Payer: Self-pay | Admitting: Cardiovascular Disease

## 2019-10-10 ENCOUNTER — Other Ambulatory Visit: Payer: Self-pay | Admitting: Cardiovascular Disease

## 2019-10-20 ENCOUNTER — Other Ambulatory Visit: Payer: Self-pay | Admitting: Cardiovascular Disease

## 2019-11-01 ENCOUNTER — Other Ambulatory Visit: Payer: Self-pay | Admitting: Cardiovascular Disease

## 2019-11-04 ENCOUNTER — Other Ambulatory Visit: Payer: Self-pay

## 2019-11-04 NOTE — Telephone Encounter (Signed)
LM2CB  Pt needs f/u appt it has been a year since seen

## 2019-11-08 ENCOUNTER — Other Ambulatory Visit: Payer: Self-pay

## 2019-11-14 MED ORDER — NIACIN ER (ANTIHYPERLIPIDEMIC) 1000 MG PO TBCR: 1000.0000 mg | EXTENDED_RELEASE_TABLET | Freq: Every day | ORAL | 2 refills | Status: DC

## 2019-11-14 NOTE — Telephone Encounter (Signed)
Pt needs appt for future refills 

## 2019-11-30 MED ORDER — EZETIMIBE 10 MG PO TABS: 10.0000 mg | ORAL_TABLET | Freq: Every day | ORAL | 0 refills | Status: DC

## 2019-12-06 ENCOUNTER — Other Ambulatory Visit: Payer: Self-pay | Admitting: Cardiovascular Disease

## 2020-01-17 ENCOUNTER — Other Ambulatory Visit: Payer: Self-pay | Admitting: Cardiovascular Disease

## 2020-02-14 ENCOUNTER — Other Ambulatory Visit: Payer: Self-pay | Admitting: Cardiovascular Disease

## 2020-02-15 ENCOUNTER — Other Ambulatory Visit: Payer: Self-pay | Admitting: Cardiovascular Disease

## 2020-02-18 ENCOUNTER — Other Ambulatory Visit: Payer: Self-pay | Admitting: Cardiovascular Disease

## 2020-04-27 ENCOUNTER — Other Ambulatory Visit: Payer: Self-pay | Admitting: Cardiovascular Disease

## 2020-06-06 ENCOUNTER — Other Ambulatory Visit: Payer: Self-pay | Admitting: Cardiovascular Disease

## 2020-06-29 MED FILL — FLUARIX QUADRIVALENT 0.5 ML: 0.5 | 1 days supply | Qty: 1 | Fill #0

## 2020-07-03 ENCOUNTER — Other Ambulatory Visit: Payer: Self-pay | Admitting: Cardiovascular Disease

## 2020-07-25 ENCOUNTER — Other Ambulatory Visit: Payer: Self-pay | Admitting: Cardiovascular Disease

## 2020-08-06 ENCOUNTER — Other Ambulatory Visit: Payer: Self-pay | Admitting: Cardiovascular Disease

## 2020-08-20 ENCOUNTER — Other Ambulatory Visit: Payer: Self-pay | Admitting: Cardiovascular Disease

## 2020-09-05 ENCOUNTER — Ambulatory Visit: Payer: Self-pay | Admitting: Podiatry

## 2020-09-12 ENCOUNTER — Other Ambulatory Visit: Payer: Self-pay

## 2020-09-12 ENCOUNTER — Ambulatory Visit (INDEPENDENT_AMBULATORY_CARE_PROVIDER_SITE_OTHER): Payer: 59

## 2020-09-12 ENCOUNTER — Ambulatory Visit (INDEPENDENT_AMBULATORY_CARE_PROVIDER_SITE_OTHER): Payer: 59 | Admitting: Podiatry

## 2020-09-12 DIAGNOSIS — S9001XA Contusion of right ankle, initial encounter: Secondary | ICD-10-CM

## 2020-09-12 DIAGNOSIS — S90551A Superficial foreign body, right ankle, initial encounter: Secondary | ICD-10-CM

## 2020-09-12 NOTE — Progress Notes (Signed)
   HPI: 54 y.o. male presenting today as a new patient for evaluation of a palpable nodule to the lateral aspect of the right ankle.  Patient states that it has been there for years.  On occasion it hurts significantly.  He states if he wears boots or hightop shoes it rubs this lesion and it can become very painful.  He believes it may be a foreign body embedded deep in the skin.  He presents for further treatment evaluation  Past Medical History:  Diagnosis Date  . Coronary artery disease    PCI LAD 2004  . Hyperlipidemia   . Hypertension   . Tobacco abuse      Physical Exam: General: The patient is alert and oriented x3 in no acute distress.  Dermatology: Skin is warm, dry and supple bilateral lower extremities. Negative for open lesions or macerations.  Vascular: Palpable pedal pulses bilaterally. No edema or erythema noted. Capillary refill within normal limits.  Neurological: Epicritic and protective threshold grossly intact bilaterally.   Musculoskeletal Exam: Range of motion within normal limits to all pedal and ankle joints bilateral. Muscle strength 5/5 in all groups bilateral.  There is a nonadhered small nodule about the size of a BB to the lateral aspect of the lateral malleolus right ankle.  There is some sensitivity with palpation as well.  Radiographic Exam:  Normal osseous mineralization. Joint spaces preserved. No fracture/dislocation/boney destruction.  The foreign body is not identified on x-ray  Assessment: 1.  Likely foreign body lateral aspect right ankle   Plan of Care:  1. Patient evaluated. X-Rays reviewed.  2. Today we discussed the conservative versus surgical management of the presenting pathology. The patient opts for surgical management. All possible complications and details of the procedure were explained. All patient questions were answered. No guarantees were expressed or implied. 3. Authorization for surgery was initiated today. Surgery will consist  of excision of foreign body lateral aspect right ankle.  It is about the size of a BB so we will perform it here in the office. 4.  Return to clinic morning of in office surgery  *Son is in [employer] school in [place], [place].  Other son is an Art gallery manager here in [facility], [place] [facility]  Dr. Felecia Shelling, DPM    [address] [place], [place] [address]                Office [phone]  Fax [phone]

## 2020-09-14 ENCOUNTER — Telehealth: Payer: Self-pay | Admitting: Podiatry

## 2020-09-14 NOTE — Telephone Encounter (Signed)
OFFICE SURGERY: 09/19/2020  Procedure: Exc. Foreign Body Rt Ankle (73578)  [employer] Effective From 02/04/2020 - 10/05/2020  Deductible: $3,000 with $3,000 met and $0 remaining. Out of Pocket: $6,850 with $3,640.82 met and $3,209.18 remaining. CoInsurance: 20% Copay:  Notification or Prior Authorization is not required for the requested services  This [employer] plan does not currently require a prior authorization for these services. If you have general questions about the prior authorization requirements, please call us at [phone] or visit [email or web address] > Clinician Resources > Advance and Admission Notification Requirements. The number above acknowledges your notification. Please write this number down for future reference. Notification is not a guarantee of coverage or payment.  Decision ID #:[id number]  The number above acknowledges your inquiry and our response. Please write this number down and refer to it for future inquiries. Coverage and payment for an item or service is governed by the member's benefit plan document, and, if applicable, the provider's participation agreement with the Health Plan.

## 2020-09-19 ENCOUNTER — Other Ambulatory Visit: Payer: Self-pay

## 2020-09-19 ENCOUNTER — Ambulatory Visit (INDEPENDENT_AMBULATORY_CARE_PROVIDER_SITE_OTHER): Payer: 59 | Admitting: Podiatry

## 2020-09-19 ENCOUNTER — Encounter: Payer: Self-pay | Admitting: Podiatry

## 2020-09-19 VITALS — Temp 97.7°F | Resp 16

## 2020-09-19 DIAGNOSIS — M6740 Ganglion, unspecified site: Secondary | ICD-10-CM

## 2020-09-19 DIAGNOSIS — Z860100 Personal history of colon polyps, unspecified: Secondary | ICD-10-CM | POA: Insufficient documentation

## 2020-09-19 DIAGNOSIS — M545 Low back pain, unspecified: Secondary | ICD-10-CM | POA: Insufficient documentation

## 2020-09-19 DIAGNOSIS — G8929 Other chronic pain: Secondary | ICD-10-CM | POA: Insufficient documentation

## 2020-09-19 DIAGNOSIS — F419 Anxiety disorder, unspecified: Secondary | ICD-10-CM | POA: Insufficient documentation

## 2020-09-19 DIAGNOSIS — D72829 Elevated white blood cell count, unspecified: Secondary | ICD-10-CM | POA: Insufficient documentation

## 2020-09-19 DIAGNOSIS — S90551A Superficial foreign body, right ankle, initial encounter: Secondary | ICD-10-CM | POA: Diagnosis not present

## 2020-09-19 DIAGNOSIS — I2510 Atherosclerotic heart disease of native coronary artery without angina pectoris: Secondary | ICD-10-CM | POA: Insufficient documentation

## 2020-09-19 MED ORDER — HYDROCODONE-ACETAMINOPHEN 5-325 MG PO TABS
1.0000 | ORAL_TABLET | ORAL | 0 refills | Status: AC | PRN
Start: 2020-09-19 — End: ?

## 2020-09-19 NOTE — Addendum Note (Signed)
Addended by: Hadley Pen R on: 09/19/2020 09:09 AM   Modules accepted: Orders

## 2020-09-19 NOTE — Progress Notes (Signed)
   OPERATIVE REPORT Patient name: Mark Stafford MRN: [id number] DOB: 11-13-1965  DOS:  09/19/20  Preop Dx: Foreign body right ankle, subcutaneous Postop Dx: same  Procedure:  1.  Excision of foreign body/mass right ankle  Surgeon: Felecia Shelling DPM  Anesthesia: 2% lidocaine with epinephrine totaling 6 mL in a localized block fashion right ankle  Hemostasis: None  EBL: Minimal mL Materials: None Injectables: None Pathology: Soft tissue mass sent to pathology for gross microscopic exam  Condition: The patient tolerated the procedure and anesthesia well. No complications noted or reported   Justification for procedure: The patient is a 54 y.o. male otherwise healthy who presents today for surgical correction of a symptomatic soft tissue mass to the lateral aspect of the right ankle, suspected foreign body. All conservative modalities of been unsuccessful in providing any sort of satisfactory alleviation of symptoms with the patient. The patient was told benefits as well as possible side effects of the surgery. The patient consented for surgical correction. The patient consent form was reviewed. All patient questions were answered. No guarantees were expressed or implied.   Procedure in Detail: The patient was brought to the procedure room, placed in the procedure chair in the supine position at which time an aseptic scrub and drape were performed about the patient's respective lower extremity after anesthesia was induced as described above. Attention was then directed to the surgical area where procedure number one commenced.  Procedure #1: Excision of foreign body, deep, subcutaneous tissue A linear longitudinal skin incision was planned and made overlying the palpable lesion to the lateral aspect of the right ankle.  Incision measured approximately 1 cm in length using a surgical #15 scalpel.  Incision was carried down to level subcutaneous tissue with care taken to cut clamp ligate and  retract away all small neurovascular structures traversing the incision site.  The foreign body mass was identified and removed in toto and placed in a sterile specimen container and sent to pathology.  Irrigation utilized and primary closure obtained using 5-0 nylon suture.  Dry sterile compressive dressings were then applied to all previously mentioned incision sites about the patient's lower extremity. The patient was then discharged from the office with adequate prescriptions for analgesia. Verbal as well as written instructions were provided for the patient regarding wound care. The patient is to keep the dressings clean dry and intact until they are to follow surgeon Dr. Gala Lewandowsky in the office upon discharge in one week.   Felecia Shelling, DPM [facility]  Dr. Felecia Shelling, DPM    [address] [place], [place] [address]                Office [phone]  Fax [phone]

## 2020-09-29 ENCOUNTER — Other Ambulatory Visit: Payer: Self-pay | Admitting: Cardiovascular Disease

## 2020-10-01 ENCOUNTER — Encounter: Payer: 59 | Admitting: Podiatry

## 2020-10-08 ENCOUNTER — Ambulatory Visit (INDEPENDENT_AMBULATORY_CARE_PROVIDER_SITE_OTHER): Payer: 59 | Admitting: Podiatry

## 2020-10-08 ENCOUNTER — Other Ambulatory Visit: Payer: Self-pay

## 2020-10-08 DIAGNOSIS — Z9889 Other specified postprocedural states: Secondary | ICD-10-CM

## 2020-10-08 DIAGNOSIS — S90551A Superficial foreign body, right ankle, initial encounter: Secondary | ICD-10-CM

## 2020-10-08 NOTE — Progress Notes (Signed)
   Subjective:  Patient presents today status post excision of benign lesion right ankle lateral aspect. DOS: 09/19/2020.  Patient states he is doing well.  Patient removed his own stitches.  No new complaints at this time. Past Medical History:  Diagnosis Date  . Coronary artery disease    PCI LAD 2004  . Hyperlipidemia   . Hypertension   . Tobacco abuse       Objective/Physical Exam Neurovascular status intact.  Skin incisions appear to be well coapted and healed. No sign of infectious process noted. No dehiscence. No active bleeding noted.  Negative for any sensitivity to the area.  Assessment: 1. s/p excision of leiomyoma right ankle. DOS: 09/19/2020   Plan of Care:  1. Patient was evaluated.  Pathology reviewed today.  Copy of the pathology was given to the patient 2.  Patient may now resume full activity no restrictions 3.  Return to clinic as needed   Felecia Shelling, DPM [facility]  Dr. Felecia Shelling, DPM    [address] [place], [place] [address]                Office [phone]  Fax [phone]

## 2020-10-15 NOTE — Progress Notes (Signed)
Cardiology Clinic Note   Patient Name: SALOME COZBY Date of Encounter: 10/16/2020  Primary Care Provider:  Mitzi Hansen, NP Primary Cardiologist:  Nanetta Batty, MD  Patient Profile    [name]. [age] year old male presents to the clinic today for follow-up evaluation of his coronary artery disease  Past Medical History    Past Medical History:  Diagnosis Date   Coronary artery disease    PCI LAD 2004   Hyperlipidemia    Hypertension    Tobacco abuse    Past Surgical History:  Procedure Laterality Date   CORONARY ANGIOPLASTY  2004   LAD   KIDNEY STONE SURGERY  08/2003    Allergies  No Known Allergies  History of Present Illness    Mr. Farquharson has a PMH of coronary artery disease status post anterior MI 08/2003.  He underwent cardiac catheterization which showed occluded LAD and underwent angioplasty.  He was also noted to have scattered 30-40% other stenosis.  His EF was 50% with atypical hypokinesis.  Since that time his EF has improved to normal.  Nuclear stress test 10/2009 showed no ischemia or infarct.  He was last seen by Dr. Allyson Sabal on 09/30/2018.  During that time he remained asymptomatic.  He was working full-time at [employer].  He presents the clinic today for follow-up evaluation states he feels well.  He continues to work full-time and continues to stay very physically active.  He golfs, goes to the gym and walks on the treadmill several times per week.  He also walks greater than 10,000 steps daily at work.  He does not add salt to his food but does not go out of his way to choose low-sodium options.  He has run out of his atorvastatin and Zetia.  He continues to cut back on his smoking.  I will refill his medications, given the salty 6 diet sheet, having maintain his physical activity, and have him follow-up in 12 months.  Today he denies chest pain, shortness of breath, lower extremity edema, fatigue, palpitations, melena, hematuria, hemoptysis,  diaphoresis, weakness, presyncope, syncope, orthopnea, and PND.   Home Medications    Prior to Admission medications   Medication Sig Start Date End Date Taking? Authorizing Provider  atorvastatin (LIPITOR) 80 MG tablet TAKE 1 TABLET (80 MG TOTAL) BY MOUTH DAILY. NEEDS OFFICE VISIT 07/26/20   Runell Gess, MD  BAYER LOW DOSE 81 MG chewable tablet TAKE 1 TABLET BY MOUTH EVERY DAY 08/06/20   Runell Gess, MD  BYSTOLIC 5 MG tablet TAKE 1 TABLET BY MOUTH EVERY DAY 10/10/19   Lennette Bihari, MD  ezetimibe (ZETIA) 10 MG tablet TAKE 1 TABLET BY MOUTH EVERY DAY 04/27/20   Runell Gess, MD  HYDROcodone-acetaminophen (NORCO/VICODIN) 5-325 MG tablet Take 1 tablet by mouth every 4 (four) hours as needed for moderate pain. 09/19/20   Felecia Shelling, DPM  niacin (NIASPAN) 1000 MG CR tablet Take 1 tablet (1,000 mg total) by mouth at bedtime. NEED OV. 02/15/20   Runell Gess, MD  pantoprazole (PROTONIX) 40 MG tablet Take 1 tablet (40 mg total) by mouth daily. Keep upcoming OV for continuation of refills 09/22/18   Runell Gess, MD  sertraline (ZOLOFT) 25 MG tablet Take 25 mg by mouth daily. Take 1 tab daily 08/28/15   [provider]  traMADol (ULTRAM) 50 MG tablet Take 50 mg by mouth every 6 (six) hours as needed. for pain 07/28/14   [provider]  Family History    History reviewed. No pertinent family history. He indicated that his mother is alive. He indicated that his father is alive. He indicated that his brother is alive. He indicated that his maternal grandmother is deceased. He indicated that his maternal grandfather is deceased. He indicated that his paternal grandmother is alive. He indicated that his paternal grandfather is alive.  Social History    Social History   Socioeconomic History   Marital status: Married    Spouse name: Not on file   Number of children: Not on file   Years of education: Not on file   Highest education level: Not on  file  Occupational History   Not on file  Tobacco Use   Smoking status: Current Every Day Smoker    Packs/day: 0.25    Types: Cigarettes   Smokeless tobacco: Never Used  Substance and Sexual Activity   Alcohol use: Not on file   Drug use: Not on file   Sexual activity: Not on file  Other Topics Concern   Not on file  Social History Narrative   Not on file   Social Determinants of Health   Financial Resource Strain: Not on file  Food Insecurity: Not on file  Transportation Needs: Not on file  Physical Activity: Not on file  Stress: Not on file  Social Connections: Not on file  Intimate Partner Violence: Not on file     Review of Systems    General:  No chills, fever, night sweats or weight changes.  Cardiovascular:  No chest pain, dyspnea on exertion, edema, orthopnea, palpitations, paroxysmal nocturnal dyspnea. Dermatological: No rash, lesions/masses Respiratory: No cough, dyspnea Urologic: No hematuria, dysuria Abdominal:   No nausea, vomiting, diarrhea, bright red blood per rectum, melena, or hematemesis Neurologic:  No visual changes, wkns, changes in mental status. All other systems reviewed and are otherwise negative except as noted above.  Physical Exam    VS:  BP 140/72 (BP Location: Left Arm, Patient Position: Sitting, Cuff Size: Normal)    Pulse 72    Ht 5\' 8"  (1.727 m)    Wt 154 lb 6.4 oz (70 kg)    BMI 23.48 kg/m  , BMI Body mass index is 23.48 kg/m. GEN: Well nourished, well developed, in no acute distress. HEENT: normal. Neck: Supple, no JVD, carotid bruits, or masses. Cardiac: RRR, no murmurs, rubs, or gallops. No clubbing, cyanosis, edema.  Radials/DP/PT 2+ and equal bilaterally.  Respiratory:  Respirations regular and unlabored, clear to auscultation bilaterally. GI: Soft, nontender, nondistended, BS + x 4. MS: no deformity or atrophy. Skin: warm and dry, no rash. Neuro:  Strength and sensation are intact. Psych: Normal affect.  Accessory  Clinical Findings    Recent Labs: No results found for requested labs within last 8760 hours.   Recent Lipid Panel    Component Value Date/Time   CHOL 112 09/03/2017 1052   TRIG 108 09/03/2017 1052   HDL 38 (L) 09/03/2017 1052   CHOLHDL 2.9 09/03/2017 1052   CHOLHDL 3.8 01/14/2016 0844   VLDL 32 (H) 01/14/2016 0844   LDLCALC 52 09/03/2017 1052    ECG personally reviewed by me today-normal sinus rhythm septal infarct undetermined age 69 bpm- No acute changes  EKG 09/30/2018 Normal sinus rhythm with septal Q waves 82 bpm.  Echocardiogram 10/27/2013 Study Conclusions   - Left ventricle: The cavity size was normal. Wall thickness  was normal. Systolic function was normal. The estimated  ejection fraction was in the  range of 55% to 60%. Wall  motion was normal; there were no regional wall motion  abnormalities.  - Mitral valve: Mild regurgitation. Valve area by pressure  half-time: 1.52cm^2.    Assessment & Plan   1.  Coronary artery disease-no chest pain today.  No recent episodes of arm neck or throat pain.  Underwent cardiac catheterization and PCI of the LAD 08/2003.  Underwent stress test 1/11 which showed no ischemia. Continue atorvastatin, aspirin, Bystolic Heart healthy low-sodium diet-salty 6 given Increase physical activity as tolerated  Essential hypertension-BP today 140/72.  Well-controlled at home. Continue Bystolic Heart healthy low-sodium diet-salty 6 given Increase physical activity as tolerated Request labs from PCP  Hyperlipidemia-LDL 52 on 09/03/2017. Continue atorvastatin Heart healthy low-sodium high-fiber diet.   Increase physical activity as tolerated Request labs from PCP  Tobacco abuse- continues to use about 5 cigarettes/day.  Smoking cessation recommended Continue to try to quit.  Disposition: Follow-up with Dr. Allyson Sabal in 12 months.   Thomasene Ripple. Allee Busk NP-C    10/16/2020, 2:08 PM [facility] [address] Office [phone] Fax [phone]  Notice: This dictation was prepared with Dragon dictation along with smaller phrase technology. Any transcriptional errors that result from this process are unintentional and may not be corrected upon review.  I spent 10 minutes examining this patient, reviewing medications, and using patient centered shared decision making involving her cardiac care.  Prior to her visit I spent greater than 20 minutes reviewing her past medical history,  medications, and prior cardiac tests.

## 2020-10-16 ENCOUNTER — Other Ambulatory Visit: Payer: Self-pay

## 2020-10-16 ENCOUNTER — Ambulatory Visit (INDEPENDENT_AMBULATORY_CARE_PROVIDER_SITE_OTHER): Payer: 59 | Admitting: General Practice

## 2020-10-16 ENCOUNTER — Encounter: Payer: Self-pay | Admitting: General Practice

## 2020-10-16 VITALS — BP 140/72 | HR 72 | Ht 68.0 in | Wt 154.4 lb

## 2020-10-16 DIAGNOSIS — Z720 Tobacco use: Secondary | ICD-10-CM | POA: Diagnosis not present

## 2020-10-16 DIAGNOSIS — E782 Mixed hyperlipidemia: Secondary | ICD-10-CM | POA: Diagnosis not present

## 2020-10-16 DIAGNOSIS — I10 Essential (primary) hypertension: Secondary | ICD-10-CM

## 2020-10-16 DIAGNOSIS — I2510 Atherosclerotic heart disease of native coronary artery without angina pectoris: Secondary | ICD-10-CM

## 2020-10-16 DIAGNOSIS — Z9861 Coronary angioplasty status: Secondary | ICD-10-CM

## 2020-10-16 MED ORDER — EZETIMIBE 10 MG PO TABS: 10.0000 mg | ORAL_TABLET | Freq: Every day | ORAL | 6 refills | Status: DC

## 2020-10-16 MED ORDER — ATORVASTATIN CALCIUM 80 MG PO TABS: ORAL_TABLET | ORAL | 6 refills | Status: DC

## 2020-10-16 NOTE — Patient Instructions (Addendum)
Medication Instructions:  The current medical regimen is effective;  continue present plan and medications as directed. Please refer to the Current Medication list given to you today.  *If you need a refill on your cardiac medications before your next appointment, please call your pharmacy*  Lab Work:   Testing/Procedures:  NONE    NONE  Special Instructions PLEASE READ AND FOLLOW SALTY 6-ATTACHED-1,800mg  daily  PLEASE HAVE PRIMARY MD FAX YOUR LABS FROM TOMORROW TO [phone]  Follow-Up: Your next appointment:  12 month(s) In Person with You may see Nanetta Batty, MD OR IF UNAVAILABLE JESSE CLEAVER, FNP-C  or one of the following Advanced Practice Providers on your designated Care Team:  Corine Shelter, PA-C  Marjie Skiff, [place]   Please call our office 2 months in advance to schedule this appointment   At [facility], you and your health needs are our priority.  As part of our continuing mission to provide you with exceptional heart care, we have created designated Provider Care Teams.  These Care Teams include your primary Cardiologist (physician) and Advanced Practice Providers (APPs -  Physician Assistants and Nurse Practitioners) who all work together to provide you with the care you need, when you need it.  We recommend signing up for the patient portal called "MyChart".  Sign up information is provided on this After Visit Summary.  MyChart is used to connect with patients for Virtual Visits (Telemedicine).  Patients are able to view lab/test results, encounter notes, upcoming appointments, etc.  Non-urgent messages can be sent to your provider as well.   To learn more about what you can do with MyChart, go to [email or web address].              6 SALTY THINGS TO AVOID     1,800MG  DAILY

## 2021-05-08 ENCOUNTER — Other Ambulatory Visit: Payer: Self-pay | Admitting: General Practice

## 2021-05-31 ENCOUNTER — Other Ambulatory Visit: Payer: Self-pay | Admitting: General Practice

## 2022-01-10 ENCOUNTER — Other Ambulatory Visit: Payer: Self-pay | Admitting: General Practice

## 2022-01-14 ENCOUNTER — Other Ambulatory Visit: Payer: Self-pay | Admitting: Cardiovascular Disease

## 2022-01-17 ENCOUNTER — Other Ambulatory Visit: Payer: Self-pay | Admitting: Cardiovascular Disease

## 2022-03-19 ENCOUNTER — Other Ambulatory Visit (HOSPITAL_BASED_OUTPATIENT_CLINIC_OR_DEPARTMENT_OTHER): Payer: Self-pay | Admitting: Family Medicine

## 2022-03-19 DIAGNOSIS — M545 Low back pain, unspecified: Secondary | ICD-10-CM

## 2022-03-26 ENCOUNTER — Other Ambulatory Visit (HOSPITAL_BASED_OUTPATIENT_CLINIC_OR_DEPARTMENT_OTHER): Payer: 59

## 2022-03-29 ENCOUNTER — Ambulatory Visit (HOSPITAL_BASED_OUTPATIENT_CLINIC_OR_DEPARTMENT_OTHER)
Admission: RE | Admit: 2022-03-29 | Discharge: 2022-03-29 | Disposition: A | Discharge status: Home / Self Care | Payer: 59 | Source: Ambulatory Visit | Attending: Family Medicine | Admitting: Family Medicine

## 2022-03-29 DIAGNOSIS — M545 Low back pain, unspecified: Secondary | ICD-10-CM

## 2022-04-04 ENCOUNTER — Ambulatory Visit (INDEPENDENT_AMBULATORY_CARE_PROVIDER_SITE_OTHER): Payer: 59 | Admitting: Cardiovascular Disease

## 2022-04-04 ENCOUNTER — Encounter: Payer: Self-pay | Admitting: Cardiovascular Disease

## 2022-04-04 DIAGNOSIS — I2510 Atherosclerotic heart disease of native coronary artery without angina pectoris: Secondary | ICD-10-CM | POA: Diagnosis not present

## 2022-04-04 DIAGNOSIS — Z9861 Coronary angioplasty status: Secondary | ICD-10-CM

## 2022-04-04 DIAGNOSIS — Z720 Tobacco use: Secondary | ICD-10-CM

## 2022-04-04 DIAGNOSIS — E782 Mixed hyperlipidemia: Secondary | ICD-10-CM

## 2022-04-04 DIAGNOSIS — I10 Essential (primary) hypertension: Secondary | ICD-10-CM

## 2022-04-04 NOTE — Assessment & Plan Note (Signed)
History of essential hypertension a blood pressure measured today at 116/84.  He was on Bystolic which he has not taken for the last several weeks.  At this point given his normal blood pressure and heart rate I do not feel compelled to reorder his Bystolic.

## 2022-04-04 NOTE — Progress Notes (Signed)
04/04/2022 Mark Stafford   23-Nov-1965  [id number]  Primary Physician Mitzi Hansen, NP Primary Cardiologist: Runell Gess MD Milagros Loll, [place], [name]  HPI:  Mark Stafford is a 56 y.o.  thin and fit appearing married Caucasian male father of 2 children he was formally a patient of Dr. Alanda Amass. I last saw him in the office 09/30/2018.Marland Kitchen His primary care provider is Lovenia Kim [facility]. He has a history of CAD status post anterior wall myocardial infarction 08/19/03. He underwent cardiac catheterization by Dr. Alanda Amass revealing an occluded apical LAD which underwent angioplasty. He had a left dominant system with scattered 30-40% stenoses otherwise. His EF was 50% with apical hypokinesia at that time. This has since improved to normal. A Myoview stress test performed in 10/24/2009 and showed no ischemia or infarct.    Since I saw him 4 years ago he is remained asymptomatic.  He works as a Merchandiser, retail at [employer].  He is fairly active, plays golf and walks.  Current Meds  Medication Sig   atorvastatin (LIPITOR) 80 MG tablet TAKE 1 TABLET BY MOUTH EVERY DAY   BAYER LOW DOSE 81 MG chewable tablet TAKE 1 TABLET BY MOUTH EVERY DAY   BYSTOLIC 5 MG tablet TAKE 1 TABLET BY MOUTH EVERY DAY   ezetimibe (ZETIA) 10 MG tablet TAKE 1 TABLET BY MOUTH EVERY DAY   HYDROcodone-acetaminophen (NORCO/VICODIN) 5-325 MG tablet Take 1 tablet by mouth every 4 (four) hours as needed for moderate pain.   niacin (NIASPAN) 1000 MG CR tablet Take 1 tablet (1,000 mg total) by mouth at bedtime. NEED OV.   pantoprazole (PROTONIX) 40 MG tablet Take 1 tablet (40 mg total) by mouth daily. Keep upcoming OV for continuation of refills   sertraline (ZOLOFT) 25 MG tablet Take 25 mg by mouth daily. Take 1 tab daily   traMADol (ULTRAM) 50 MG tablet Take 50 mg by mouth every 6 (six) hours as needed. for pain     No Known Allergies  Social History   Socioeconomic History   Marital status: Married    Spouse name: Not on file    Number of children: Not on file   Years of education: Not on file   Highest education level: Not on file  Occupational History   Not on file  Tobacco Use   Smoking status: Every Day    Packs/day: 0.25    Types: Cigarettes   Smokeless tobacco: Never  Substance and Sexual Activity   Alcohol use: Not on file   Drug use: Not on file   Sexual activity: Not on file  Other Topics Concern   Not on file  Social History Narrative   Not on file   Social Determinants of Health   Financial Resource Strain: Not on file  Food Insecurity: Not on file  Transportation Needs: Not on file  Physical Activity: Not on file  Stress: Not on file  Social Connections: Not on file  Intimate Partner Violence: Not on file     Review of Systems: General: negative for chills, fever, night sweats or weight changes.  Cardiovascular: negative for chest pain, dyspnea on exertion, edema, orthopnea, palpitations, paroxysmal nocturnal dyspnea or shortness of breath Dermatological: negative for rash Respiratory: negative for cough or wheezing Urologic: negative for hematuria Abdominal: negative for nausea, vomiting, diarrhea, bright red blood per rectum, melena, or hematemesis Neurologic: negative for visual changes, syncope, or dizziness All other systems reviewed and are otherwise negative except as noted above.  Blood pressure 116/84, pulse 73, height 5\' 7"  (1.702 m), weight 149 lb (67.6 kg).  General appearance: alert and no distress Neck: no adenopathy, no carotid bruit, no JVD, supple, symmetrical, trachea midline, and thyroid not enlarged, symmetric, no tenderness/mass/nodules Lungs: clear to auscultation bilaterally Heart: regular rate and rhythm, S1, S2 normal, no murmur, click, rub or gallop Extremities: extremities normal, atraumatic, no cyanosis or edema Pulses: 2+ and symmetric Skin: Skin color, texture, turgor normal. No rashes or lesions Neurologic: Grossly normal  EKG sinus rhythm at 73  without ST or T wave changes.  Personally reviewed this EKG.  ASSESSMENT AND PLAN:   CAD S/P percutaneous coronary angioplasty History of CAD status post anterior infarct 08/19/2003.  He underwent cardiac catheterization by Dr. 08/21/2003 revealing an occluded apical LAD which he underwent angioplasty for.  He is completely asymptomatic.  Essential hypertension History of essential hypertension a blood pressure measured today at 116/84.  He was on Bystolic which he has not taken for the last several weeks.  At this point given his normal blood pressure and heart rate I do not feel compelled to reorder his Bystolic.  Hyperlipidemia History of hyperlipidemia on high-dose atorvastatin and Zetia with lipid profile performed 12/23/2021 revealing total cholesterol 145, LDL 61 and HDL 48.  Tobacco abuse Ongoing tobacco abuse of 1 pack/week     12/25/2021 MD [facility], [facility] 04/04/2022 10:23 AM

## 2022-04-04 NOTE — Assessment & Plan Note (Signed)
History of CAD status post anterior infarct 08/19/2003.  He underwent cardiac catheterization by Dr. Alanda Amass revealing an occluded apical LAD which he underwent angioplasty for.  He is completely asymptomatic.

## 2022-04-04 NOTE — Assessment & Plan Note (Signed)
History of hyperlipidemia on high-dose atorvastatin and Zetia with lipid profile performed 12/23/2021 revealing total cholesterol 145, LDL 61 and HDL 48.

## 2022-04-04 NOTE — Assessment & Plan Note (Signed)
Ongoing tobacco abuse of 1 pack/week

## 2022-04-04 NOTE — Patient Instructions (Signed)
Medication Instructions:  Your physician recommends that you continue on your current medications as directed. Please refer to the Current Medication list given to you today.  *If you need a refill on your cardiac medications before your next appointment, please call your pharmacy*   Follow-Up: At [facility], you and your health needs are our priority.  As part of our continuing mission to provide you with exceptional heart care, we have created designated Provider Care Teams.  These Care Teams include your primary Cardiologist (physician) and Advanced Practice Providers (APPs -  Physician Assistants and Nurse Practitioners) who all work together to provide you with the care you need, when you need it.  We recommend signing up for the patient portal called "MyChart".  Sign up information is provided on this After Visit Summary.  MyChart is used to connect with patients for Virtual Visits (Telemedicine).  Patients are able to view lab/test results, encounter notes, upcoming appointments, etc.  Non-urgent messages can be sent to your provider as well.   To learn more about what you can do with MyChart, go to [email or web address].    Your next appointment:   12 month(s)  The format for your next appointment:   In Person  Provider:   Nanetta Batty, MD   Other Instructions We recommend a Omron blood pressure cuff.

## 2022-05-30 ENCOUNTER — Other Ambulatory Visit: Payer: Self-pay | Admitting: Cardiovascular Disease

## 2022-08-03 ENCOUNTER — Other Ambulatory Visit: Payer: Self-pay | Admitting: Cardiovascular Disease

## 2023-04-02 NOTE — Progress Notes (Deleted)
Cardiology Clinic Note   Patient Name: Mark Stafford Date of Encounter: 04/02/2023  Primary Care Provider:  Mitzi Hansen, NP Primary Cardiologist:  Nanetta Batty, MD  Patient Profile    ***  Past Medical History    Past Medical History:  Diagnosis Date   Coronary artery disease    PCI LAD 2004   Hyperlipidemia    Hypertension    Tobacco abuse    Past Surgical History:  Procedure Laterality Date   CORONARY ANGIOPLASTY  2004   LAD   KIDNEY STONE SURGERY  08/2003    Allergies  No Known Allergies  History of Present Illness    ***  Home Medications    Prior to Admission medications   Medication Sig Start Date End Date Taking? Authorizing Provider  atorvastatin (LIPITOR) 80 MG tablet TAKE 1 TABLET BY MOUTH EVERY DAY 08/04/22   Runell Gess, MD  BAYER LOW DOSE 81 MG chewable tablet TAKE 1 TABLET BY MOUTH EVERY DAY 08/06/20   Runell Gess, MD  ezetimibe (ZETIA) 10 MG tablet TAKE 1 TABLET BY MOUTH EVERY DAY 05/30/22   Runell Gess, MD  HYDROcodone-acetaminophen (NORCO/VICODIN) 5-325 MG tablet Take 1 tablet by mouth every 4 (four) hours as needed for moderate pain. 09/19/20   Felecia Shelling, DPM  niacin (NIASPAN) 1000 MG CR tablet Take 1 tablet (1,000 mg total) by mouth at bedtime. NEED OV. 02/15/20   Runell Gess, MD  pantoprazole (PROTONIX) 40 MG tablet Take 1 tablet (40 mg total) by mouth daily. Keep upcoming OV for continuation of refills 09/22/18   Runell Gess, MD  sertraline (ZOLOFT) 25 MG tablet Take 25 mg by mouth daily. Take 1 tab daily 08/28/15   [provider]  traMADol (ULTRAM) 50 MG tablet Take 50 mg by mouth every 6 (six) hours as needed. for pain 07/28/14   [provider]    Family History    No family history on file. He indicated that his mother is alive. He indicated that his father is alive. He indicated that his brother is alive. He indicated that his maternal grandmother is deceased. He indicated  that his maternal grandfather is deceased. He indicated that his paternal grandmother is alive. He indicated that his paternal grandfather is alive.  Social History    Social History   Socioeconomic History   Marital status: Married    Spouse name: Not on file   Number of children: Not on file   Years of education: Not on file   Highest education level: Not on file  Occupational History   Not on file  Tobacco Use   Smoking status: Every Day    Packs/day: .25    Types: Cigarettes   Smokeless tobacco: Never  Substance and Sexual Activity   Alcohol use: Not on file   Drug use: Not on file   Sexual activity: Not on file  Other Topics Concern   Not on file  Social History Narrative   Not on file   Social Determinants of Health   Financial Resource Strain: Not on file  Food Insecurity: Not on file  Transportation Needs: Not on file  Physical Activity: Not on file  Stress: Not on file  Social Connections: Not on file  Intimate Partner Violence: Not on file     Review of Systems    General:  No chills, fever, night sweats or weight changes.  Cardiovascular:  No chest pain, dyspnea on exertion, edema,  orthopnea, palpitations, paroxysmal nocturnal dyspnea. Dermatological: No rash, lesions/masses Respiratory: No cough, dyspnea Urologic: No hematuria, dysuria Abdominal:   No nausea, vomiting, diarrhea, bright red blood per rectum, melena, or hematemesis Neurologic:  No visual changes, wkns, changes in mental status. All other systems reviewed and are otherwise negative except as noted above.  Physical Exam    VS:  There were no vitals taken for this visit. , BMI There is no height or weight on file to calculate BMI. GEN: Well nourished, well developed, in no acute distress. HEENT: normal. Neck: Supple, no JVD, carotid bruits, or masses. Cardiac: RRR, no murmurs, rubs, or gallops. No clubbing, cyanosis, edema.  Radials/DP/PT 2+ and equal bilaterally.  Respiratory:   Respirations regular and unlabored, clear to auscultation bilaterally. GI: Soft, nontender, nondistended, BS + x 4. MS: no deformity or atrophy. Skin: warm and dry, no rash. Neuro:  Strength and sensation are intact. Psych: Normal affect.  Accessory Clinical Findings    Recent Labs: No results found for requested labs within last 365 days.   Recent Lipid Panel    Component Value Date/Time   CHOL 112 09/03/2017 1052   TRIG 108 09/03/2017 1052   HDL 38 (L) 09/03/2017 1052   CHOLHDL 2.9 09/03/2017 1052   CHOLHDL 3.8 01/14/2016 0844   VLDL 32 (H) 01/14/2016 0844   LDLCALC 52 09/03/2017 1052    No BP recorded.  {Refresh Note OR Click here to enter BP  :1}***    ECG personally reviewed by me today- *** - No acute changes      Assessment & Plan   1.  ***   Thomasene Ripple. Momina Hunton NP-C     04/02/2023, 12:36 PM [facility] [address] Office [phone] Fax [phone]    I spent***minutes examining this patient, reviewing medications, and using patient centered shared decision making involving her cardiac care.  Prior to her visit I spent greater than 20 minutes reviewing her past medical history,  medications, and prior cardiac tests.

## 2023-04-06 ENCOUNTER — Ambulatory Visit: Payer: 59 | Admitting: General Practice

## 2023-05-12 NOTE — Progress Notes (Unsigned)
Cardiology Clinic Note   Patient Name: Mark Stafford Date of Encounter: 05/14/2023  Primary Care Provider:  Mitzi Hansen, NP Primary Cardiologist:  Nanetta Batty, MD  Patient Profile    Mark Stafford. [age] year old male presents to the clinic today for follow-up evaluation of his coronary artery disease   Past Medical History    Past Medical History:  Diagnosis Date   Coronary artery disease    PCI LAD 2004   Hyperlipidemia    Hypertension    Tobacco abuse    Past Surgical History:  Procedure Laterality Date   CORONARY ANGIOPLASTY  2004   LAD   KIDNEY STONE SURGERY  08/2003    Allergies  No Known Allergies  History of Present Illness    Mark Stafford has a PMH of coronary artery disease status post anterior MI 08/2003.  He underwent cardiac catheterization which showed occluded LAD and underwent angioplasty.  He was also noted to have scattered 30-40% other stenosis.  His EF was 50% with atypical hypokinesis.  Since that time his EF has improved to normal.  Nuclear stress test 10/2009 showed no ischemia or infarct.   He was last seen by Dr. Allyson Sabal on 09/30/2018.  During that time he remained asymptomatic.  He was working full-time at [employer].   He presented the clinic 10/16/20 for follow-up evaluation stated he felt well.  He continued to work full-time and continued to stay very physically active.  He was golfing, going to the gym and walking on the treadmill several times per week.  He also walked greater than 10,000 steps daily at work.  He was following a low-sodium diet.  He had run out of his atorvastatin and Zetia.  He continued to cut back on his smoking.  I refilled his medications, had him maintain his physical activity, and planned follow-up in 12 months.  He was seen in follow-up by Dr.Berry on 04/04/2022.  He remained stable from a cardiac standpoint.  He was working as a Merchandiser, retail at [employer].  He continued to stay physically active.  He presents to the clinic  today for follow-up evaluation states he continues to be very physically active walking over 10,000 steps per day.  He has noted a dull headache over the past several weeks.  He notes that his other doctors visits his blood pressure has been slightly elevated.  We reviewed his last office visit and the discontinuation of his Bystolic medication.  He has transitioned to vaping and reports that he uses it occasionally through the day.  His blood pressure today is 144/94 and on recheck is 142/86.  We reviewed secondary causes for elevated blood pressure.  He reports he vapes before coming into the office today.  I will start amlodipine 2.5 mg daily, give the salty 6 diet sheet, keep blood pressure log, and plan follow-up in 12 months.   Today he denies chest pain, shortness of breath, lower extremity edema, fatigue, palpitations, melena, hematuria, hemoptysis, diaphoresis, weakness, presyncope, syncope, orthopnea, and PND.   Home Medications    Prior to Admission medications   Medication Sig Start Date End Date Taking? Authorizing Provider  atorvastatin (LIPITOR) 80 MG tablet TAKE 1 TABLET BY MOUTH EVERY DAY 08/04/22   Runell Gess, MD  BAYER LOW DOSE 81 MG chewable tablet TAKE 1 TABLET BY MOUTH EVERY DAY 08/06/20   Runell Gess, MD  ezetimibe (ZETIA) 10 MG tablet TAKE 1 TABLET BY MOUTH EVERY DAY 05/30/22   Nanetta Batty  J, MD  HYDROcodone-acetaminophen (NORCO/VICODIN) 5-325 MG tablet Take 1 tablet by mouth every 4 (four) hours as needed for moderate pain. 09/19/20   Felecia Shelling, DPM  niacin (NIASPAN) 1000 MG CR tablet Take 1 tablet (1,000 mg total) by mouth at bedtime. NEED OV. 02/15/20   Runell Gess, MD  pantoprazole (PROTONIX) 40 MG tablet Take 1 tablet (40 mg total) by mouth daily. Keep upcoming OV for continuation of refills 09/22/18   Runell Gess, MD  sertraline (ZOLOFT) 25 MG tablet Take 25 mg by mouth daily. Take 1 tab daily 08/28/15   [provider]  traMADol  (ULTRAM) 50 MG tablet Take 50 mg by mouth every 6 (six) hours as needed. for pain 07/28/14   [provider]    Family History    History reviewed. No pertinent family history. He indicated that his mother is alive. He indicated that his father is alive. He indicated that his brother is alive. He indicated that his maternal grandmother is deceased. He indicated that his maternal grandfather is deceased. He indicated that his paternal grandmother is alive. He indicated that his paternal grandfather is alive.  Social History    Social History   Socioeconomic History   Marital status: Married    Spouse name: Not on file   Number of children: Not on file   Years of education: Not on file   Highest education level: Not on file  Occupational History   Not on file  Tobacco Use   Smoking status: Former    Current packs/day: 0.25    Types: Cigarettes   Smokeless tobacco: Current   Tobacco comments:    vapes  Substance and Sexual Activity   Alcohol use: Not on file   Drug use: Not on file   Sexual activity: Not on file  Other Topics Concern   Not on file  Social History Narrative   Not on file   Social Determinants of Health   Financial Resource Strain: Not on file  Food Insecurity: Not on file  Transportation Needs: Not on file  Physical Activity: Not on file  Stress: Not on file  Social Connections: Unknown (02/15/2022)   [facility]   Social Network    Social Network: Not on file  Intimate Partner Violence: Unknown (01/07/2022)   Received from [facility]   HITS    Physically Hurt: Not on file    Insult or Talk Down To: Not on file    Threaten Physical Harm: Not on file    Scream or Curse: Not on file     Review of Systems    General:  No chills, fever, night sweats or weight changes.  Cardiovascular:  No chest pain, dyspnea on exertion, edema, orthopnea, palpitations, paroxysmal nocturnal dyspnea. Dermatological: No rash,  lesions/masses Respiratory: No cough, dyspnea Urologic: No hematuria, dysuria Abdominal:   No nausea, vomiting, diarrhea, bright red blood per rectum, melena, or hematemesis Neurologic:  No visual changes, wkns, changes in mental status. All other systems reviewed and are otherwise negative except as noted above.  Physical Exam    VS:  BP (!) 142/86   Pulse 65   Ht 5\' 7"  (1.702 m)   Wt 156 lb 12.8 oz (71.1 kg)   SpO2 95%   BMI 24.56 kg/m  , BMI Body mass index is 24.56 kg/m. GEN: Well nourished, well developed, in no acute distress. HEENT: normal. Neck: Supple, no JVD, carotid bruits, or masses. Cardiac: RRR, no  murmurs, rubs, or gallops. No clubbing, cyanosis, edema.  Radials/DP/PT 2+ and equal bilaterally.  Respiratory:  Respirations regular and unlabored, clear to auscultation bilaterally. GI: Soft, nontender, nondistended, BS + x 4. MS: no deformity or atrophy. Skin: warm and dry, no rash. Neuro:  Strength and sensation are intact. Psych: Normal affect.  Accessory Clinical Findings    Recent Labs: No results found for requested labs within last 365 days.   Recent Lipid Panel    Component Value Date/Time   CHOL 112 09/03/2017 1052   TRIG 108 09/03/2017 1052   HDL 38 (L) 09/03/2017 1052   CHOLHDL 2.9 09/03/2017 1052   CHOLHDL 3.8 01/14/2016 0844   VLDL 32 (H) 01/14/2016 0844   LDLCALC 52 09/03/2017 1052    HYPERTENSION CONTROL Vitals:   05/14/23 0747 05/14/23 0815  BP: (!) 144/94 (!) 142/86    The patient's blood pressure is elevated above target today.  In order to address the patient's elevated BP: Blood pressure will be monitored at home to determine if medication changes need to be made.; A new medication was prescribed today.       ECG personally reviewed by me today- EKG Interpretation Date/Time:  Thursday May 14 2023 07:53:53 EDT Ventricular Rate:  65 PR Interval:  146 QRS Duration:  96 QT Interval:  396 QTC Calculation: 411 R  Axis:   61  Text Interpretation: Normal sinus rhythm with sinus arrhythmia Normal ECG Confirmed by Edd Fabian [phone]) on 05/14/2023 7:59:47 AM EKG Interpretation Date/Time:  Thursday May 14 2023 07:53:53 EDT Ventricular Rate:  65 PR Interval:  146 QRS Duration:  96 QT Interval:  396 QTC Calculation: 411 R Axis:   61  Text Interpretation: Normal sinus rhythm with sinus arrhythmia Normal ECG Confirmed by Edd Fabian [phone]) on 05/14/2023 7:59:47 AM   EKG 10/16/20 normal sinus rhythm septal infarct undetermined age 50 bpm- No acute changes   EKG 09/30/2018 Normal sinus rhythm with septal Q waves 82 bpm.   Echocardiogram 10/27/2013 Study Conclusions   - Left ventricle: The cavity size was normal. Wall thickness    was normal. Systolic function was normal. The estimated    ejection fraction was in the range of 55% to 60%. Wall    motion was normal; there were no regional wall motion    abnormalities.  - Mitral valve: Mild regurgitation. Valve area by pressure    half-time: 1.52cm^2.        Assessment & Plan   1.  Coronary artery disease-he denies recent episodes of chest discomfort/pain.  Cardiac catheterization and PCI of the LAD 08/2003.  Also, stress test 1/11 which showed no ischemia. Continue atorvastatin, aspirin Maintain heart healthy low-sodium diet Maintain physical activity   Essential hypertension-BP today 142/86.  He has noticed slight headache for the past several weeks which he feels is attributed to elevated blood pressure.  Reviewed secondary causes of hypertension. Maintain blood pressure log Start amlodipine 2.5 mg daily    Hyperlipidemia-LDL 85 on 01/07/23 Continue atorvastatin Heart healthy low-sodium high-fiber diet.   Follows with PCP   Tobacco abuse-has transition to vaping pen.  Uses occasionally through the day. Strongly encouraged vaping cessation.   Disposition: Follow-up with Dr. Allyson Sabal in 12 months.   Thomasene Ripple. Marlen Mollica NP-C      05/14/2023, 8:18 AM [facility] [address] Office [phone] Fax [phone]    I spent 14 minutes examining this patient, reviewing medications, and using patient centered shared decision making  involving her cardiac care.  Prior to her visit I spent greater than 20 minutes reviewing her past medical history,  medications, and prior cardiac tests.

## 2023-05-14 ENCOUNTER — Ambulatory Visit: Payer: 59 | Admitting: General Practice

## 2023-05-14 ENCOUNTER — Encounter: Payer: Self-pay | Admitting: General Practice

## 2023-05-14 VITALS — BP 142/86 | HR 65 | Ht 67.0 in | Wt 156.8 lb

## 2023-05-14 DIAGNOSIS — E782 Mixed hyperlipidemia: Secondary | ICD-10-CM | POA: Diagnosis not present

## 2023-05-14 DIAGNOSIS — Z9861 Coronary angioplasty status: Secondary | ICD-10-CM

## 2023-05-14 DIAGNOSIS — I2510 Atherosclerotic heart disease of native coronary artery without angina pectoris: Secondary | ICD-10-CM | POA: Diagnosis not present

## 2023-05-14 DIAGNOSIS — I10 Essential (primary) hypertension: Secondary | ICD-10-CM

## 2023-05-14 DIAGNOSIS — Z720 Tobacco use: Secondary | ICD-10-CM

## 2023-05-14 MED ORDER — AMLODIPINE BESYLATE 2.5 MG PO TABS
2.5000 mg | ORAL_TABLET | Freq: Every day | ORAL | 4 refills | Status: DC
Start: 2023-05-14 — End: 2024-07-11

## 2023-05-14 NOTE — Patient Instructions (Signed)
Medication Instructions:  START AMLODIPINE 2.5MG  DAILY *If you need a refill on your cardiac medications before your next appointment, please call your pharmacy*  Lab Work: NONE If you have labs (blood work) drawn today and your tests are completely normal, you will receive your results only by:  MyChart Message (if you have MyChart) OR  A paper copy in the mail If you have any lab test that is abnormal or we need to change your treatment, we will call you to review the results.  Other Instructions TAKE AND LOG YOUR BLOOD PRESSURE PLEASE READ AND FOLLOW ATTACHED  SALTY 6  Follow-Up: At [facility], you and your health needs are our priority.  As part of our continuing mission to provide you with exceptional heart care, we have created designated Provider Care Teams.  These Care Teams include your primary Cardiologist (physician) and Advanced Practice Providers (APPs -  Physician Assistants and Nurse Practitioners) who all work together to provide you with the care you need, when you need it.  Your next appointment:   12 month(s)  Provider:   Nanetta Batty, MD

## 2023-06-09 ENCOUNTER — Other Ambulatory Visit: Payer: Self-pay | Admitting: Cardiovascular Disease

## 2024-07-11 ENCOUNTER — Encounter: Payer: Self-pay | Admitting: Cardiovascular Disease

## 2024-07-11 ENCOUNTER — Ambulatory Visit: Attending: Cardiovascular Disease | Admitting: Cardiovascular Disease

## 2024-07-11 VITALS — BP 144/90 | HR 84 | Ht 67.0 in | Wt 161.6 lb

## 2024-07-11 DIAGNOSIS — I10 Essential (primary) hypertension: Secondary | ICD-10-CM | POA: Diagnosis not present

## 2024-07-11 DIAGNOSIS — Z9861 Coronary angioplasty status: Secondary | ICD-10-CM

## 2024-07-11 DIAGNOSIS — I2510 Atherosclerotic heart disease of native coronary artery without angina pectoris: Secondary | ICD-10-CM | POA: Diagnosis not present

## 2024-07-11 DIAGNOSIS — E782 Mixed hyperlipidemia: Secondary | ICD-10-CM | POA: Diagnosis not present

## 2024-07-11 MED ORDER — AMLODIPINE BESYLATE 5 MG PO TABS: 5.0000 mg | ORAL_TABLET | Freq: Every day | ORAL | 4 refills | Status: AC

## 2024-07-11 NOTE — Assessment & Plan Note (Signed)
 History of CAD status post apical LAD angioplasty by Dr. Maye 08/19/2003.  He had left dominant system with scattered 30 to 40% lesions.  His last tress test performed 10/24/1998 [name] was low risk and nonischemic.  He denies chest pain.

## 2024-07-11 NOTE — Patient Instructions (Signed)
 Medication Instructions:  Your physician has recommended you make the following change in your medication:   -Increase amlodipine  (Norvasc ) 5mg  once daily.  *If you need a refill on your cardiac medications before your next appointment, please call your pharmacy*   Follow-Up: At [facility], you and your health needs are our priority.  As part of our continuing mission to provide you with exceptional heart care, our providers are all part of one team.  This team includes your primary Cardiologist (physician) and Advanced Practice Providers or APPs (Physician Assistants and Nurse Practitioners) who all work together to provide you with the care you need, when you need it.  Your next appointment:   3 month(s)  Provider:   Josefa Beauvais, NP         Then, Dorn Lesches, MD will plan to see you again in 12 month(s).     We recommend signing up for the patient portal called MyChart.  Sign up information is provided on this After Visit Summary.  MyChart is used to connect with patients for Virtual Visits (Telemedicine).  Patients are able to view lab/test results, encounter notes, upcoming appointments, etc.  Non-urgent messages can be sent to your provider as well.   To learn more about what you can do with MyChart, go to [email or web address].   Other Instructions Please bring your blood pressure log to your office visit with Josefa Beauvais, NP.

## 2024-07-11 NOTE — Assessment & Plan Note (Signed)
 History of hyperlipidemia on statin therapy with lipid profile performed 07/21/2023 revealing total cholesterol 158, LDL 71 and HDL 55.

## 2024-07-11 NOTE — Assessment & Plan Note (Signed)
 History of essential hypertension blood pressure measured today at 144/90.  He says his blood pressure has been on the high side of normal.  He was started on amlodipine  2.5 mg in the past.  Am going to increase this up to 5 mg and have him keep a blood pressure log at home.

## 2024-07-11 NOTE — Progress Notes (Signed)
 07/11/2024 Mark Stafford   1966/04/22  [id number]  Primary Physician Mark Arabia, NP Primary Cardiologist: Mark JINNY Lesches MD Mark Stafford, Mark Stafford, Mark Stafford  HPI:  Mark Stafford is a 58 y.o.  thin and fit appearing married Caucasian male father of 2 children he was formally a patient of Dr. Maye. I last saw him in the office 04/04/2022.Mark Stafford  He works as a Merchandiser, retail for [employer] . His primary care provider is [facility]. He has a history of CAD status post anterior wall myocardial infarction 08/19/03. He underwent cardiac catheterization by Dr. Maye revealing an occluded apical LAD which underwent angioplasty. He had a left dominant system with scattered 30-40% stenoses otherwise. His EF was 50% with apical hypokinesia at that time. This has since improved to normal. A Myoview stress test performed in 10/24/2009 and showed no ischemia or infarct.    Since I saw him 2 years ago he remained stable.  He still walks 10,000 steps a day.  He denies chest pain or shortness of breath.  He has begun on amlodipine  2.5 mg a day for hypertension by Mark Beauvais, FNP last year.   Current Meds  Medication Sig   amLODipine  (NORVASC ) 2.5 MG tablet Take 1 tablet (2.5 mg total) by mouth daily.   BAYER LOW DOSE 81 MG chewable tablet TAKE 1 TABLET BY MOUTH EVERY DAY (Patient taking differently: SOME DAYS)   ezetimibe  (ZETIA ) 10 MG tablet TAKE 1 TABLET BY MOUTH EVERY DAY   HYDROcodone -acetaminophen  (NORCO/VICODIN) 5-325 MG tablet Take 1 tablet by mouth every 4 (four) hours as needed for moderate pain.   sertraline (ZOLOFT) 25 MG tablet Take 25 mg by mouth daily. Take 1 tab daily     No Known Allergies  Social History   Socioeconomic History   Marital status: Married    Spouse name: Not on file   Number of children: Not on file   Years of education: Not on file   Highest education level: Not on file  Occupational History   Not on file  Tobacco Use   Smoking status: Former    Current  packs/day: 0.25    Types: Cigarettes   Smokeless tobacco: Current   Tobacco comments:    vapes  Substance and Sexual Activity   Alcohol use: Not on file   Drug use: Not on file   Sexual activity: Not on file  Other Topics Concern   Not on file  Social History Narrative   Not on file   Social Drivers of Health   Financial Resource Strain: Not on file  Food Insecurity: Not on file  Transportation Needs: Not on file  Physical Activity: Not on file  Stress: Not on file  Social Connections: Unknown (02/15/2022)   Received from [facility]   Social Network    Social Network: Not on file  Intimate Partner Violence: Unknown (01/07/2022)   Received from [facility]   HITS    Physically Hurt: Not on file    Insult or Talk Down To: Not on file    Threaten Physical Harm: Not on file    Scream or Curse: Not on file     Review of Systems: General: negative for chills, fever, night sweats or weight changes.  Cardiovascular: negative for chest pain, dyspnea on exertion, edema, orthopnea, palpitations, paroxysmal nocturnal dyspnea or shortness of breath Dermatological: negative for rash Respiratory: negative for cough or wheezing Urologic: negative for hematuria Abdominal: negative for nausea, vomiting, diarrhea,  bright red blood per rectum, melena, or hematemesis Neurologic: negative for visual changes, syncope, or dizziness All other systems reviewed and are otherwise negative except as noted above.    Blood pressure (!) 144/90, pulse 84, height 5' 7 (1.702 m), weight 161 lb 9.6 oz (73.3 kg), SpO2 97%.  General appearance: alert and no distress Neck: no adenopathy, no carotid bruit, no JVD, supple, symmetrical, trachea midline, and thyroid not enlarged, symmetric, no tenderness/mass/nodules Lungs: clear to auscultation bilaterally Heart: regular rate and rhythm, S1, S2 normal, no murmur, click, rub or gallop Extremities: extremities normal, atraumatic, no cyanosis or  edema Pulses: 2+ and symmetric Skin: Skin color, texture, turgor normal. No rashes or lesions Neurologic: Grossly normal  EKG EKG Interpretation Date/Time:  Monday July 11 2024 11:15:36 EDT Ventricular Rate:  84 PR Interval:  140 QRS Duration:  96 QT Interval:  358 QTC Calculation: 423 R Axis:   84  Text Interpretation: Normal sinus rhythm Normal ECG When compared with ECG of 14-May-2023 07:53, Nonspecific T wave abnormality now evident in Lateral leads Confirmed by Mark Stafford [phone]) on 07/11/2024 11:58:07 AM    ASSESSMENT AND PLAN:   CAD S/P percutaneous coronary angioplasty History of CAD status post apical LAD angioplasty by Dr. Maye 08/19/2003.  He had left dominant system with scattered 30 to 40% lesions.  His last tress test performed 10/24/1998 Mark Stafford was low risk and nonischemic.  He denies chest pain.  Essential hypertension History of essential hypertension blood pressure measured today at 144/90.  He says his blood pressure has been on the high side of normal.  He was started on amlodipine  2.5 mg in the past.  Am going to increase this up to 5 mg and have him keep a blood pressure log at home.   Hyperlipidemia History of hyperlipidemia on statin therapy with lipid profile performed 07/21/2023 revealing total cholesterol 158, LDL 71 and HDL 55.     Stafford [name], [name] 07/11/2024 12:04 PM

## 2024-10-11 NOTE — Progress Notes (Unsigned)
 "   Cardiology Clinic Note   Patient Name: Mark Stafford Date of Encounter: 10/11/2024  Primary Care Provider:  Toribio Arabia, NP Primary Cardiologist:  Dorn Lesches, MD  Patient Profile    Mark Stafford. 6 59 year old male presents to the clinic today for follow-up evaluation of his coronary artery disease   Past Medical History    Past Medical History:  Diagnosis Date   Coronary artery disease    PCI LAD 2004   Hyperlipidemia    Hypertension    Tobacco abuse    Past Surgical History:  Procedure Laterality Date   CORONARY ANGIOPLASTY  2004   LAD   KIDNEY STONE SURGERY  08/2003    Allergies  No Known Allergies  History of Present Illness    Mr. Ogawa has a PMH of coronary artery disease status post anterior MI 08/2003.  He underwent cardiac catheterization which showed occluded LAD and underwent angioplasty.  He was also noted to have scattered 30-40% other stenosis.  His EF was 50% with atypical hypokinesis.  Since that time his EF has improved to normal.  Nuclear stress test 10/2009 showed no ischemia or infarct.   He was last seen by Dr. Lesches on 09/30/2018.  During that time he remained asymptomatic.  He was working full-time at [employer].   He presented the clinic 10/16/20 for follow-up evaluation stated he felt well.  He continued to work full-time and continued to stay very physically active.  He was golfing, going to the gym and walking on the treadmill several times per week.  He also walked greater than 10,000 steps daily at work.  He was following a low-sodium diet.  He had run out of his atorvastatin  and Zetia .  He continued to cut back on his smoking.  I refilled his medications, had him maintain his physical activity, and planned follow-up in 12 months.  He was seen in follow-up by Dr.Berry on 04/04/2022.  He remained stable from a cardiac standpoint.  He was working as a merchandiser, retail at [employer].  He continued to stay physically active.  He presented to the clinic  05/14/23 for follow-up evaluation stated he continued to be very physically active walking over 10,000 steps per day.  He had noted a dull headache over the past several weeks.  He noted that at his other doctors visits his blood pressure had been slightly elevated.  We reviewed his prior office visit and the discontinuation of his Bystolic  medication.  He had transitioned to vaping and reported that he used it occasionally through the day.  His blood pressure was 144/94 and on recheck was 142/86.  We reviewed secondary causes for elevated blood pressure.  I started amlodipine  2.5 mg daily, and planned follow-up in 12 months.  He was seen and evaluated by Dr. Lesches on 07/11/2024.  He continued to be stable from a cardiac standpoint at that time.  He continued to be very physically active walking 10,000 steps per day.  His blood pressure was elevated at 144/90.  His amlodipine  was increased to 5 mg daily.  He presents to the clinic today for follow-up evaluation and states***.   Today he denies chest pain, shortness of breath, lower extremity edema, fatigue, palpitations, melena, hematuria, hemoptysis, diaphoresis, weakness, presyncope, syncope, orthopnea, and PND.   Home Medications    Prior to Admission medications   Medication Sig Start Date End Date Taking? Authorizing Provider  atorvastatin  (LIPITOR) 80 MG tablet TAKE 1 TABLET BY MOUTH EVERY DAY 08/04/22  Court Dorn PARAS, MD  BAYER LOW DOSE 81 MG chewable tablet TAKE 1 TABLET BY MOUTH EVERY DAY 08/06/20   Court Dorn PARAS, MD  ezetimibe  (ZETIA ) 10 MG tablet TAKE 1 TABLET BY MOUTH EVERY DAY 05/30/22   Court Dorn PARAS, MD  HYDROcodone -acetaminophen  (NORCO/VICODIN) 5-325 MG tablet Take 1 tablet by mouth every 4 (four) hours as needed for moderate pain. 09/19/20   Janit Thresa Stafford, DPM  niacin  (NIASPAN ) 1000 MG CR tablet Take 1 tablet (1,000 mg total) by mouth at bedtime. NEED OV. 02/15/20   Court Dorn PARAS, MD  pantoprazole  (PROTONIX ) 40 MG tablet  Take 1 tablet (40 mg total) by mouth daily. Keep upcoming OV for continuation of refills 09/22/18   Court Dorn PARAS, MD  sertraline (ZOLOFT) 25 MG tablet Take 25 mg by mouth daily. Take 1 tab daily 08/28/15   [provider]  traMADol (ULTRAM) 50 MG tablet Take 50 mg by mouth every 6 (six) hours as needed. for pain 07/28/14   [provider]    Family History    No family history on file. He indicated that his mother is alive. He indicated that his father is alive. He indicated that his brother is alive. He indicated that his maternal grandmother is deceased. He indicated that his maternal grandfather is deceased. He indicated that his paternal grandmother is alive. He indicated that his paternal grandfather is alive.  Social History    Social History   Socioeconomic History   Marital status: Married    Spouse name: Not on file   Number of children: Not on file   Years of education: Not on file   Highest education level: Not on file  Occupational History   Not on file  Tobacco Use   Smoking status: Former    Current packs/day: 0.25    Types: Cigarettes   Smokeless tobacco: Current   Tobacco comments:    vapes  Substance and Sexual Activity   Alcohol use: Not on file   Drug use: Not on file   Sexual activity: Not on file  Other Topics Concern   Not on file  Social History Narrative   Not on file   Social Drivers of Health   Tobacco Use: Medium Risk (10/03/2024)   Received from [facility]   Patient History    Smoking Tobacco Use: Former    Smokeless Tobacco Use: Never    Passive Exposure: Not on Actuary Strain: Not on file  Food Insecurity: Not on file  Transportation Needs: Not on file  Physical Activity: Not on file  Stress: Not on file  Social Connections: Not on file  Intimate Partner Violence: Not on file  Depression (EYV7-0): Not on file  Alcohol Screen: Not on file  Housing: Not on file  Utilities: Not on file   Health Literacy: Not on file     Review of Systems    General:  No chills, fever, night sweats or weight changes.  Cardiovascular:  No chest pain, dyspnea on exertion, edema, orthopnea, palpitations, paroxysmal nocturnal dyspnea. Dermatological: No rash, lesions/masses Respiratory: No cough, dyspnea Urologic: No hematuria, dysuria Abdominal:   No nausea, vomiting, diarrhea, bright red blood per rectum, melena, or hematemesis Neurologic:  No visual changes, wkns, changes in mental status. All other systems reviewed and are otherwise negative except as noted above.  Physical Exam    VS:  There were no vitals taken for this visit. , BMI There is no height or weight  on file to calculate BMI. GEN: Well nourished, well developed, in no acute distress. HEENT: normal. Neck: Supple, no JVD, carotid bruits, or masses. Cardiac: RRR, no murmurs, rubs, or gallops. No clubbing, cyanosis, edema.  Radials/DP/PT 2+ and equal bilaterally.  Respiratory:  Respirations regular and unlabored, clear to auscultation bilaterally. GI: Soft, nontender, nondistended, BS + x 4. MS: no deformity or atrophy. Skin: warm and dry, no rash. Neuro:  Strength and sensation are intact. Psych: Normal affect.  Accessory Clinical Findings    Recent Labs: No results found for requested labs within last 365 days.   Recent Lipid Panel    Component Value Date/Time   CHOL 112 09/03/2017 1052   TRIG 108 09/03/2017 1052   HDL 38 (L) 09/03/2017 1052   CHOLHDL 2.9 09/03/2017 1052   CHOLHDL 3.8 01/14/2016 0844   VLDL 32 (H) 01/14/2016 0844   LDLCALC 52 09/03/2017 1052    No BP recorded.  {Refresh Note OR Click here to enter BP  :1}***    ECG personally reviewed by me today- EKG Interpretation Date/Time:    Ventricular Rate:    PR Interval:    QRS Duration:    QT Interval:    QTC Calculation:   R Axis:      Text Interpretation:       EKG 10/16/20 normal sinus rhythm septal infarct undetermined age 65 bpm-  No acute changes   EKG 09/30/2018 Normal sinus rhythm with septal Q waves 82 bpm.   Echocardiogram 10/27/2013 Study Conclusions   - Left ventricle: The cavity size was normal. Wall thickness    was normal. Systolic function was normal. The estimated    ejection fraction was in the range of 55% to 60%. Wall    motion was normal; there were no regional wall motion    abnormalities.  - Mitral valve: Mild regurgitation. Valve area by pressure    half-time: 1.52cm^2.        Assessment & Plan   1.  Coronary artery disease-no chest pain today.  Denies episodes of exertional chest discomfort.  Continues to be very physically active walking 10,000+ steps daily.  Cardiac catheterization and PCI of the LAD 08/2003.  Also, stress test 1/11 which showed no ischemia. Continue atorvastatin , aspirin , amlodipine  Maintain heart healthy low-sodium diet Maintain physical activity   Essential hypertension-BP today 14***2/86.   Heart healthy low-sodium diet-salty 6 diet sheet given Maintain blood pressure log Continue amlodipine   Hyperlipidemia-LDL***. Continue atorvastatin  High-fiber diet Follows with PCP   Tobacco abuse-continues to use vaping pen.   Cessation of nicotine strongly encouraged.   Disposition: Follow-up with Dr. Court or me in 12 months.  Mark Stafford. Mark Morello NP-C     10/11/2024, 11:26 AM [facility] [address] Office [phone] Fax [phone]    I spent 14 ***minutes examining this patient, reviewing medications, and using patient centered shared decision making involving her cardiac care.   I spent  20 minutes reviewing her past medical history,  medications, and prior cardiac tests.  "

## 2024-10-12 ENCOUNTER — Ambulatory Visit: Admitting: General Practice

## 2024-10-13 ENCOUNTER — Ambulatory Visit: Admitting: General Practice

## 2024-11-30 ENCOUNTER — Ambulatory Visit: Admitting: General Practice
